# Patient Record
Sex: Male | Born: 1970 | Race: White | Hispanic: No | Marital: Single | Smoking: Former smoker
Health system: Southern US, Community
[De-identification: ages and names within clinical notes are randomized; demographics above are authoritative.]

## PROBLEM LIST (undated history)

## (undated) DIAGNOSIS — T4145XA Adverse effect of unspecified anesthetic, initial encounter: Secondary | ICD-10-CM

## (undated) DIAGNOSIS — I10 Essential (primary) hypertension: Secondary | ICD-10-CM

## (undated) DIAGNOSIS — T8859XA Other complications of anesthesia, initial encounter: Secondary | ICD-10-CM

## (undated) DIAGNOSIS — M1990 Unspecified osteoarthritis, unspecified site: Secondary | ICD-10-CM

## (undated) DIAGNOSIS — J189 Pneumonia, unspecified organism: Secondary | ICD-10-CM

## (undated) DIAGNOSIS — N419 Inflammatory disease of prostate, unspecified: Secondary | ICD-10-CM

## (undated) HISTORY — PX: CYST EXCISION: SHX5701

## (undated) HISTORY — PX: OTHER SURGICAL HISTORY: SHX169

---
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
## 2011-01-19 ENCOUNTER — Ambulatory Visit: Payer: Self-pay | Admitting: Internal Medicine

## 2016-03-16 ENCOUNTER — Other Ambulatory Visit: Payer: Self-pay | Admitting: Orthopaedic Surgery

## 2016-03-18 ENCOUNTER — Encounter (HOSPITAL_COMMUNITY): Payer: Self-pay

## 2016-03-18 ENCOUNTER — Encounter (HOSPITAL_COMMUNITY)
Admission: RE | Admit: 2016-03-18 | Discharge: 2016-03-18 | Disposition: A | Discharge status: Home / Self Care | Payer: BLUE CROSS/BLUE SHIELD | Source: Ambulatory Visit | Attending: Orthopaedic Surgery | Admitting: Orthopaedic Surgery

## 2016-03-18 DIAGNOSIS — I10 Essential (primary) hypertension: Secondary | ICD-10-CM | POA: Insufficient documentation

## 2016-03-18 DIAGNOSIS — Z01812 Encounter for preprocedural laboratory examination: Secondary | ICD-10-CM | POA: Diagnosis not present

## 2016-03-18 DIAGNOSIS — R001 Bradycardia, unspecified: Secondary | ICD-10-CM | POA: Insufficient documentation

## 2016-03-18 DIAGNOSIS — M1612 Unilateral primary osteoarthritis, left hip: Secondary | ICD-10-CM | POA: Diagnosis not present

## 2016-03-18 DIAGNOSIS — Z01818 Encounter for other preprocedural examination: Secondary | ICD-10-CM | POA: Diagnosis present

## 2016-03-18 HISTORY — DX: Inflammatory disease of prostate, unspecified: N41.9

## 2016-03-18 HISTORY — DX: Essential (primary) hypertension: I10

## 2016-03-18 HISTORY — DX: Unspecified osteoarthritis, unspecified site: M19.90

## 2016-03-18 HISTORY — DX: Pneumonia, unspecified organism: J18.9

## 2016-03-18 LAB — BASIC METABOLIC PANEL
ANION GAP: 9 (ref 5–15)
BUN: 11 mg/dL (ref 6–20)
CALCIUM: 8.9 mg/dL (ref 8.9–10.3)
CO2: 26 mmol/L (ref 22–32)
Chloride: 102 mmol/L (ref 101–111)
Creatinine, Ser: 0.87 mg/dL (ref 0.61–1.24)
GFR calc Af Amer: >60 mL/min (ref >60)
GLUCOSE: 91 mg/dL (ref 65–99)
POTASSIUM: 3.7 mmol/L (ref 3.5–5.1)
SODIUM: 137 mmol/L (ref 135–145)

## 2016-03-18 LAB — CBC
HCT: 42 % (ref 39.0–52.0)
HEMOGLOBIN: 14.6 g/dL (ref 13.0–17.0)
MCH: 31.5 pg (ref 26.0–34.0)
MCHC: 34.8 g/dL (ref 30.0–36.0)
MCV: 90.7 fL (ref 78.0–100.0)
Platelets: 191 10*3/uL (ref 150–400)
RBC: 4.63 MIL/uL (ref 4.22–5.81)
RDW: 12.3 % (ref 11.5–15.5)
WBC: 4.7 10*3/uL (ref 4.0–10.5)

## 2016-03-18 LAB — PROTIME-INR
INR: 1.09 (ref 0.00–1.49)
PROTHROMBIN TIME: 13.8 s (ref 11.6–15.2)

## 2016-03-18 LAB — SURGICAL PCR SCREEN
MRSA, PCR: NEGATIVE
STAPHYLOCOCCUS AUREUS: NEGATIVE

## 2016-03-18 NOTE — Patient Instructions (Signed)
Neithan Day Much  03/18/2016   Your procedure is scheduled on: Friday April 02, 2016  Report to [facility] Main  Entrance take [place]  elevators to 3rd floor to  [place] at 11:15 AM.  Call this number if you have problems the morning of surgery [phone]   Remember: ONLY 1 PERSON MAY GO WITH YOU TO SHORT STAY TO GET  READY MORNING OF [place].  Do not eat food After Midnight but may take clear liquids till 7:15 am day of surgery then nothing by mouth.      Take these medicines the morning of surgery with A SIP OF WATER: Propranolol (Inderal)                               You may not have any metal on your body including hair pins and              piercings  Do not wear jewelry,lotions, powders or colognes, deodorant             Men may shave face and neck.   Do not bring valuables to the hospital. [place].  Contacts, dentures or bridgework may not be worn into surgery.  Leave suitcase in the car. After surgery it may be brought to your room.                Please read over the following fact sheets you were given:MRSA INFORMATION SHEET  _____________________________________________________________________             [facility] - Preparing for Surgery Before surgery, you can play an important role.  Because skin is not sterile, your skin needs to be as free of germs as possible.  You can reduce the number of germs on your skin by washing with CHG (chlorahexidine gluconate) soap before surgery.  CHG is an antiseptic cleaner which kills germs and bonds with the skin to continue killing germs even after washing. Please DO NOT use if you have an allergy to CHG or antibacterial soaps.  If your skin becomes reddened/irritated stop using the CHG and inform your nurse when you arrive at Short Stay. Do not shave (including legs and underarms) for at least 48 hours prior to the first CHG shower.  You may  shave your face/neck. Please follow these instructions carefully:  1.  Shower with CHG Soap the night before surgery and the  morning of Surgery.  2.  If you choose to wash your hair, wash your hair first as usual with your  normal  shampoo.  3.  After you shampoo, rinse your hair and body thoroughly to remove the  shampoo.                           4.  Use CHG as you would any other liquid soap.  You can apply chg directly  to the skin and wash                       Gently with a scrungie or clean washcloth.  5.  Apply the CHG Soap to your body ONLY FROM THE NECK DOWN.   Do not use  on face/ open                           Wound or open sores. Avoid contact with eyes, ears mouth and genitals (private parts).                       Wash face,  Genitals (private parts) with your normal soap.             6.  Wash thoroughly, paying special attention to the area where your surgery  will be performed.  7.  Thoroughly rinse your body with warm water from the neck down.  8.  DO NOT shower/wash with your normal soap after using and rinsing off  the CHG Soap.                9.  Pat yourself dry with a clean towel.            10.  Wear clean pajamas.            11.  Place clean sheets on your bed the night of your first shower and do not  sleep with pets. Day of Surgery : Do not apply any lotions/deodorants the morning of surgery.  Please wear clean clothes to the hospital/surgery center.  FAILURE TO FOLLOW THESE INSTRUCTIONS MAY RESULT IN THE CANCELLATION OF YOUR SURGERY PATIENT SIGNATURE_________________________________  NURSE SIGNATURE__________________________________  ________________________________________________________________________    CLEAR LIQUID DIET   Foods Allowed                                                                     Foods Excluded  Coffee and tea, regular and decaf                             liquids that you cannot  Plain Jell-O in any flavor                                              see through such as: Fruit ices (not with fruit pulp)                                     milk, soups, orange juice  Iced Popsicles                                    All solid food Carbonated beverages, regular and diet                                    Cranberry, grape and apple juices Sports drinks like Gatorade Lightly seasoned clear broth or consume(fat free) Sugar, honey syrup  Sample Menu Breakfast  Lunch                                     Supper Cranberry juice                    Beef broth                            Chicken broth Jell-O                                     Grape juice                           Apple juice Coffee or tea                        Jell-O                                      Popsicle                                                Coffee or tea                        Coffee or tea  _____________________________________________________________________

## 2016-04-02 ENCOUNTER — Inpatient Hospital Stay (HOSPITAL_COMMUNITY): Payer: BLUE CROSS/BLUE SHIELD | Admitting: Certified Registered Nurse Anesthetist

## 2016-04-02 ENCOUNTER — Inpatient Hospital Stay (HOSPITAL_COMMUNITY): Payer: BLUE CROSS/BLUE SHIELD

## 2016-04-02 ENCOUNTER — Inpatient Hospital Stay (HOSPITAL_COMMUNITY)
Admission: RE | Admit: 2016-04-02 | Discharge: 2016-04-06 | DRG: 470 | Disposition: A | Discharge status: Home Health | Payer: BLUE CROSS/BLUE SHIELD | Source: Ambulatory Visit | Attending: Orthopaedic Surgery | Admitting: Orthopaedic Surgery

## 2016-04-02 ENCOUNTER — Encounter (HOSPITAL_COMMUNITY): Payer: Self-pay | Admitting: *Deleted

## 2016-04-02 ENCOUNTER — Encounter (HOSPITAL_COMMUNITY)
Admission: RE | Disposition: A | Discharge status: Home Health | Payer: Self-pay | Source: Ambulatory Visit | Attending: Orthopaedic Surgery

## 2016-04-02 DIAGNOSIS — Z96642 Presence of left artificial hip joint: Secondary | ICD-10-CM

## 2016-04-02 DIAGNOSIS — Y844 Aspiration of fluid as the cause of abnormal reaction of the patient, or of later complication, without mention of misadventure at the time of the procedure: Secondary | ICD-10-CM | POA: Diagnosis not present

## 2016-04-02 DIAGNOSIS — Z87891 Personal history of nicotine dependence: Secondary | ICD-10-CM | POA: Diagnosis not present

## 2016-04-02 DIAGNOSIS — I10 Essential (primary) hypertension: Secondary | ICD-10-CM | POA: Diagnosis present

## 2016-04-02 DIAGNOSIS — M1612 Unilateral primary osteoarthritis, left hip: Secondary | ICD-10-CM | POA: Diagnosis present

## 2016-04-02 DIAGNOSIS — G971 Other reaction to spinal and lumbar puncture: Secondary | ICD-10-CM | POA: Diagnosis not present

## 2016-04-02 DIAGNOSIS — Z419 Encounter for procedure for purposes other than remedying health state, unspecified: Secondary | ICD-10-CM

## 2016-04-02 DIAGNOSIS — M25552 Pain in left hip: Secondary | ICD-10-CM | POA: Diagnosis present

## 2016-04-02 HISTORY — PX: TOTAL HIP ARTHROPLASTY: SHX124

## 2016-04-02 LAB — TYPE AND SCREEN
ABO/RH(D): O NEG
Antibody Screen: NEGATIVE

## 2016-04-02 LAB — ABO/RH: ABO/RH(D): O NEG

## 2016-04-02 SURGERY — ARTHROPLASTY, HIP, TOTAL, ANTERIOR APPROACH
Anesthesia: Spinal | Site: Hip | Laterality: Left

## 2016-04-02 MED ORDER — LACTATED RINGERS IV SOLN
INTRAVENOUS | Status: DC | PRN
  Administered 2016-04-02 (×3): via INTRAVENOUS

## 2016-04-02 MED ORDER — ONDANSETRON HCL 4 MG/2ML IJ SOLN
INTRAMUSCULAR | Status: AC
Start: 2016-04-02 — End: 2016-04-02
  Filled 2016-04-02: qty 2

## 2016-04-02 MED ORDER — FENTANYL CITRATE (PF) 100 MCG/2ML IJ SOLN: 25.0000 ug | INTRAMUSCULAR | Status: DC | PRN

## 2016-04-02 MED ORDER — KETOROLAC TROMETHAMINE 15 MG/ML IJ SOLN
7.5000 mg | Freq: Four times a day (QID) | INTRAMUSCULAR | Status: AC
  Administered 2016-04-02 – 2016-04-03 (×4): 7.5 mg via INTRAVENOUS
  Filled 2016-04-02 (×3): qty 1

## 2016-04-02 MED ORDER — PROPOFOL 10 MG/ML IV BOLUS
INTRAVENOUS | Status: AC
  Filled 2016-04-02: qty 40

## 2016-04-02 MED ORDER — PHENYLEPHRINE HCL 10 MG/ML IJ SOLN
INTRAMUSCULAR | Status: DC | PRN
  Administered 2016-04-02 (×4): 80 ug via INTRAVENOUS

## 2016-04-02 MED ORDER — ASPIRIN EC 325 MG PO TBEC
325.0000 mg | DELAYED_RELEASE_TABLET | Freq: Two times a day (BID) | ORAL | Status: DC
  Administered 2016-04-03 – 2016-04-06 (×7): 325 mg via ORAL
  Filled 2016-04-02 (×7): qty 1

## 2016-04-02 MED ORDER — METOCLOPRAMIDE HCL 5 MG PO TABS
5.0000 mg | ORAL_TABLET | Freq: Three times a day (TID) | ORAL | Status: DC | PRN
Start: 2016-04-02 — End: 2016-04-06

## 2016-04-02 MED ORDER — SODIUM CHLORIDE 0.9 % IR SOLN
Status: DC | PRN
  Administered 2016-04-02: 1000 mL

## 2016-04-02 MED ORDER — DEXAMETHASONE SODIUM PHOSPHATE 10 MG/ML IJ SOLN
INTRAMUSCULAR | Status: AC
  Filled 2016-04-02: qty 1

## 2016-04-02 MED ORDER — PROPRANOLOL HCL ER 80 MG PO CP24
80.0000 mg | ORAL_CAPSULE | Freq: Every day | ORAL | Status: DC
  Administered 2016-04-03 – 2016-04-05 (×3): 80 mg via ORAL
  Filled 2016-04-02 (×4): qty 1

## 2016-04-02 MED ORDER — ONDANSETRON HCL 4 MG/2ML IJ SOLN
INTRAMUSCULAR | Status: DC | PRN
  Administered 2016-04-02: 4 mg via INTRAVENOUS

## 2016-04-02 MED ORDER — POLYETHYLENE GLYCOL 3350 17 G PO PACK: 17.0000 g | PACK | Freq: Every day | ORAL | Status: DC | PRN

## 2016-04-02 MED ORDER — HYDROMORPHONE HCL 1 MG/ML IJ SOLN
0.2500 mg | INTRAMUSCULAR | Status: DC | PRN
  Administered 2016-04-02 (×3): 0.5 mg via INTRAVENOUS

## 2016-04-02 MED ORDER — SODIUM CHLORIDE 0.9 % IV SOLN
INTRAVENOUS | Status: DC
  Administered 2016-04-02 – 2016-04-04 (×5): via INTRAVENOUS

## 2016-04-02 MED ORDER — CEFAZOLIN SODIUM-DEXTROSE 2-4 GM/100ML-% IV SOLN
2.0000 g | INTRAVENOUS | Status: AC
  Administered 2016-04-02: 2 g via INTRAVENOUS

## 2016-04-02 MED ORDER — CEFAZOLIN SODIUM-DEXTROSE 2-4 GM/100ML-% IV SOLN
INTRAVENOUS | Status: AC
  Filled 2016-04-02: qty 100

## 2016-04-02 MED ORDER — PHENYLEPHRINE 40 MCG/ML (10ML) SYRINGE FOR IV PUSH (FOR BLOOD PRESSURE SUPPORT)
PREFILLED_SYRINGE | INTRAVENOUS | Status: AC
  Filled 2016-04-02: qty 10

## 2016-04-02 MED ORDER — MIDAZOLAM HCL 5 MG/5ML IJ SOLN
INTRAMUSCULAR | Status: DC | PRN
  Administered 2016-04-02: 2 mg via INTRAVENOUS

## 2016-04-02 MED ORDER — MIDAZOLAM HCL 2 MG/2ML IJ SOLN
INTRAMUSCULAR | Status: AC
Start: 2016-04-02 — End: 2016-04-02
  Filled 2016-04-02: qty 2

## 2016-04-02 MED ORDER — METOCLOPRAMIDE HCL 5 MG/ML IJ SOLN: 5.0000 mg | Freq: Three times a day (TID) | INTRAMUSCULAR | Status: DC | PRN

## 2016-04-02 MED ORDER — FENTANYL CITRATE (PF) 100 MCG/2ML IJ SOLN
INTRAMUSCULAR | Status: AC
Start: 2016-04-02 — End: 2016-04-02
  Filled 2016-04-02: qty 2

## 2016-04-02 MED ORDER — HYDROMORPHONE HCL 1 MG/ML IJ SOLN
INTRAMUSCULAR | Status: AC
  Filled 2016-04-02: qty 1

## 2016-04-02 MED ORDER — OXYCODONE HCL 5 MG PO TABS
5.0000 mg | ORAL_TABLET | ORAL | Status: DC | PRN
  Administered 2016-04-03 – 2016-04-06 (×13): 10 mg via ORAL
  Filled 2016-04-02 (×14): qty 2

## 2016-04-02 MED ORDER — MENTHOL 3 MG MT LOZG: 1.0000 | LOZENGE | OROMUCOSAL | Status: DC | PRN

## 2016-04-02 MED ORDER — METHOCARBAMOL 1000 MG/10ML IJ SOLN
500.0000 mg | Freq: Four times a day (QID) | INTRAVENOUS | Status: DC | PRN
  Administered 2016-04-02 – 2016-04-03 (×3): 500 mg via INTRAVENOUS
  Filled 2016-04-02: qty 550
  Filled 2016-04-02: qty 5
  Filled 2016-04-02: qty 550
  Filled 2016-04-02 (×3): qty 5

## 2016-04-02 MED ORDER — DIPHENHYDRAMINE HCL 12.5 MG/5ML PO ELIX: 12.5000 mg | ORAL_SOLUTION | ORAL | Status: DC | PRN

## 2016-04-02 MED ORDER — FENTANYL CITRATE (PF) 100 MCG/2ML IJ SOLN
INTRAMUSCULAR | Status: AC
  Filled 2016-04-02: qty 2

## 2016-04-02 MED ORDER — MEPERIDINE HCL 50 MG/ML IJ SOLN: 6.2500 mg | INTRAMUSCULAR | Status: DC | PRN

## 2016-04-02 MED ORDER — PHENOL 1.4 % MT LIQD
1.0000 | OROMUCOSAL | Status: DC | PRN
Start: 2016-04-02 — End: 2016-04-06
  Filled 2016-04-02: qty 177

## 2016-04-02 MED ORDER — PROPOFOL 500 MG/50ML IV EMUL
INTRAVENOUS | Status: DC | PRN
  Administered 2016-04-02: 100 ug/kg/min via INTRAVENOUS

## 2016-04-02 MED ORDER — PROPOFOL 10 MG/ML IV BOLUS
INTRAVENOUS | Status: AC
  Filled 2016-04-02: qty 20

## 2016-04-02 MED ORDER — LIDOCAINE HCL (CARDIAC) 20 MG/ML IV SOLN
INTRAVENOUS | Status: DC | PRN
  Administered 2016-04-02: 50 mg via INTRAVENOUS

## 2016-04-02 MED ORDER — DOCUSATE SODIUM 100 MG PO CAPS
100.0000 mg | ORAL_CAPSULE | Freq: Two times a day (BID) | ORAL | Status: DC
  Administered 2016-04-02 – 2016-04-06 (×7): 100 mg via ORAL
  Filled 2016-04-02 (×7): qty 1

## 2016-04-02 MED ORDER — METHOCARBAMOL 500 MG PO TABS
500.0000 mg | ORAL_TABLET | Freq: Four times a day (QID) | ORAL | Status: DC | PRN
  Administered 2016-04-03 – 2016-04-06 (×8): 500 mg via ORAL
  Filled 2016-04-02 (×9): qty 1

## 2016-04-02 MED ORDER — TRANEXAMIC ACID 1000 MG/10ML IV SOLN
1000.0000 mg | INTRAVENOUS | Status: AC
  Administered 2016-04-02: 1000 mg via INTRAVENOUS
  Filled 2016-04-02: qty 10

## 2016-04-02 MED ORDER — ALUM & MAG HYDROXIDE-SIMETH 200-200-20 MG/5ML PO SUSP: 30.0000 mL | ORAL | Status: DC | PRN

## 2016-04-02 MED ORDER — HYDROMORPHONE HCL 1 MG/ML IJ SOLN
1.0000 mg | INTRAMUSCULAR | Status: DC | PRN
  Administered 2016-04-02 – 2016-04-03 (×4): 1 mg via INTRAVENOUS
  Filled 2016-04-02 (×4): qty 1

## 2016-04-02 MED ORDER — ONDANSETRON HCL 4 MG PO TABS: 4.0000 mg | ORAL_TABLET | Freq: Four times a day (QID) | ORAL | Status: DC | PRN

## 2016-04-02 MED ORDER — CEFAZOLIN IN D5W 1 GM/50ML IV SOLN
1.0000 g | Freq: Four times a day (QID) | INTRAVENOUS | Status: AC
  Administered 2016-04-02 – 2016-04-03 (×2): 1 g via INTRAVENOUS
  Filled 2016-04-02 (×3): qty 50

## 2016-04-02 MED ORDER — BUPIVACAINE IN DEXTROSE 0.75-8.25 % IT SOLN
INTRATHECAL | Status: DC | PRN
  Administered 2016-04-02: 1.8 mL via INTRATHECAL

## 2016-04-02 MED ORDER — FENTANYL CITRATE (PF) 100 MCG/2ML IJ SOLN
INTRAMUSCULAR | Status: DC | PRN
  Administered 2016-04-02 (×2): 50 ug via INTRAVENOUS

## 2016-04-02 MED ORDER — PROMETHAZINE HCL 25 MG/ML IJ SOLN: 6.2500 mg | INTRAMUSCULAR | Status: DC | PRN

## 2016-04-02 MED ORDER — ACETAMINOPHEN 325 MG PO TABS
650.0000 mg | ORAL_TABLET | Freq: Four times a day (QID) | ORAL | Status: DC | PRN
  Administered 2016-04-03 – 2016-04-06 (×5): 650 mg via ORAL
  Filled 2016-04-02 (×5): qty 2

## 2016-04-02 MED ORDER — ZOLPIDEM TARTRATE 5 MG PO TABS: 5.0000 mg | ORAL_TABLET | Freq: Every evening | ORAL | Status: DC | PRN

## 2016-04-02 MED ORDER — ACETAMINOPHEN 650 MG RE SUPP: 650.0000 mg | Freq: Four times a day (QID) | RECTAL | Status: DC | PRN

## 2016-04-02 MED ORDER — ONDANSETRON HCL 4 MG/2ML IJ SOLN
4.0000 mg | Freq: Four times a day (QID) | INTRAMUSCULAR | Status: DC | PRN
  Administered 2016-04-02 – 2016-04-03 (×2): 4 mg via INTRAVENOUS
  Filled 2016-04-02 (×2): qty 2

## 2016-04-02 MED ORDER — EPHEDRINE SULFATE 50 MG/ML IJ SOLN
INTRAMUSCULAR | Status: DC | PRN
  Administered 2016-04-02: 5 mg via INTRAVENOUS
  Administered 2016-04-02: 10 mg via INTRAVENOUS
  Administered 2016-04-02: 5 mg via INTRAVENOUS
  Administered 2016-04-02: 10 mg via INTRAVENOUS

## 2016-04-02 SURGICAL SUPPLY — 34 items
BAG ZIPLOCK 12X15 (MISCELLANEOUS) IMPLANT
BENZOIN TINCTURE PRP APPL 2/3 (GAUZE/BANDAGES/DRESSINGS) IMPLANT
BLADE SAW SGTL 18X1.27X75 (BLADE) ×2 IMPLANT
CAPT HIP TOTAL 2 ×2 IMPLANT
CELLS DAT CNTRL 66122 CELL SVR (MISCELLANEOUS) ×1 IMPLANT
CLOTH BEACON ORANGE TIMEOUT ST (SAFETY) ×2 IMPLANT
DRAPE STERI IOBAN 125X83 (DRAPES) ×2 IMPLANT
DRAPE U-SHAPE 47X51 STRL (DRAPES) ×4 IMPLANT
DRSG AQUACEL AG ADV 3.5X10 (GAUZE/BANDAGES/DRESSINGS) ×2 IMPLANT
DURAPREP 26ML APPLICATOR (WOUND CARE) IMPLANT
ELECT REM PT RETURN 9FT ADLT (ELECTROSURGICAL) ×2
ELECTRODE REM PT RTRN 9FT ADLT (ELECTROSURGICAL) ×1 IMPLANT
GAUZE XEROFORM 1X8 LF (GAUZE/BANDAGES/DRESSINGS) IMPLANT
GLOVE BIO SURGEON STRL SZ7.5 (GLOVE) ×2 IMPLANT
GLOVE BIOGEL PI IND STRL 8 (GLOVE) ×2 IMPLANT
GLOVE BIOGEL PI INDICATOR 8 (GLOVE) ×2
GLOVE ECLIPSE 8.0 STRL XLNG CF (GLOVE) ×2 IMPLANT
GOWN STRL REUS W/TWL XL LVL3 (GOWN DISPOSABLE) ×4 IMPLANT
HANDPIECE INTERPULSE COAX TIP (DISPOSABLE) ×1
HOLDER FOLEY CATH W/STRAP (MISCELLANEOUS) ×2 IMPLANT
PACK ANTERIOR HIP CUSTOM (KITS) ×2 IMPLANT
RTRCTR WOUND ALEXIS 18CM MED (MISCELLANEOUS) ×2
SET HNDPC FAN SPRY TIP SCT (DISPOSABLE) ×1 IMPLANT
STAPLER VISISTAT 35W (STAPLE) IMPLANT
STRIP CLOSURE SKIN 1/2X4 (GAUZE/BANDAGES/DRESSINGS) IMPLANT
SUT ETHIBOND NAB CT1 #1 30IN (SUTURE) ×2 IMPLANT
SUT MNCRL AB 4-0 PS2 18 (SUTURE) IMPLANT
SUT VIC AB 0 CT1 36 (SUTURE) ×2 IMPLANT
SUT VIC AB 1 CT1 36 (SUTURE) ×2 IMPLANT
SUT VIC AB 2-0 CT1 27 (SUTURE) ×2
SUT VIC AB 2-0 CT1 TAPERPNT 27 (SUTURE) ×2 IMPLANT
TRAY FOLEY W/METER SILVER 14FR (SET/KITS/TRAYS/PACK) IMPLANT
TRAY FOLEY W/METER SILVER 16FR (SET/KITS/TRAYS/PACK) ×2 IMPLANT
YANKAUER SUCT BULB TIP NO VENT (SUCTIONS) ×2 IMPLANT

## 2016-04-02 NOTE — Transfer of Care (Signed)
Immediate Anesthesia Transfer of Care Note  Patient: Jackson White  Procedure(s) Performed: Procedure(s): LEFT TOTAL HIP ARTHROPLASTY ANTERIOR APPROACH (Left)  Patient Location: PACU  Anesthesia Type:MAC and Spinal  Level of Consciousness:  sedated, patient cooperative and responds to stimulation  Airway & Oxygen Therapy:Patient Spontanous Breathing and Patient connected to face mask oxgen  Post-op Assessment:  Report given to PACU RN and Post -op Vital signs reviewed and stable  Post vital signs:  Reviewed and stable  Last Vitals:  Filed Vitals:   04/02/16 1120  BP: 137/74  Pulse: 60  Temp: 36.7 C  Resp: 16    Complications: No apparent anesthesia complications

## 2016-04-02 NOTE — Anesthesia Preprocedure Evaluation (Signed)
Anesthesia Evaluation  Patient identified by MRN, date of birth, ID band  Reviewed: Allergy & Precautions, H&P , NPO status , Patient's Chart, lab work & pertinent test results, reviewed documented beta blocker date and time   Airway Mallampati: II  TM Distance: >3 FB Neck ROM: full    Dental no notable dental hx. (+) Teeth Intact   Pulmonary former smoker,    Pulmonary exam normal        Cardiovascular hypertension, Pt. on home beta blockers Normal cardiovascular exam     Neuro/Psych negative neurological ROS  negative psych ROS   GI/Hepatic negative GI ROS, Neg liver ROS,   Endo/Other  negative endocrine ROS  Renal/GU negative Renal ROS     Musculoskeletal   Abdominal Normal abdominal exam  (+)   Peds  Hematology negative hematology ROS (+)   Anesthesia Other Findings   Reproductive/Obstetrics negative OB ROS                             Anesthesia Physical Anesthesia Plan  ASA: II  Anesthesia Plan: Spinal   Post-op Pain Management:    Induction:   Airway Management Planned:   Additional Equipment:   Intra-op Plan:   Post-operative Plan:   Informed Consent: I have reviewed the patients History and Physical, chart, labs and discussed the procedure including the risks, benefits and alternatives for the proposed anesthesia with the patient or authorized representative who has indicated his/her understanding and acceptance.   Dental Advisory Given  Plan Discussed with: CRNA and Surgeon  Anesthesia Plan Comments:         Anesthesia Quick Evaluation

## 2016-04-02 NOTE — H&P (Signed)
TOTAL HIP ADMISSION H&P  Patient is admitted for left total hip arthroplasty.  Subjective:  Chief Complaint: left hip pain  HPI: Jackson White, 45 y.o. male, has a history of pain and functional disability in the left hip(s) due to arthritis and patient has failed non-surgical conservative treatments for greater than 12 weeks to include NSAID's and/or analgesics, corticosteriod injections, flexibility and strengthening excercises, weight reduction as appropriate and activity modification.  Onset of symptoms was gradual starting 5 years ago with gradually worsening course since that time.The patient noted no past surgery on the left hip(s).  Patient currently rates pain in the left hip at 10 out of 10 with activity. Patient has night pain, worsening of pain with activity and weight bearing, trendelenberg gait, pain that interfers with activities of daily living, pain with passive range of motion and crepitus. Patient has evidence of subchondral cysts, subchondral sclerosis, periarticular osteophytes and joint space narrowing by imaging studies. This condition presents safety issues increasing the risk of falls.  There is no current active infection.  Patient Active Problem List   Diagnosis Date Noted  . Osteoarthritis of left hip 04/02/2016   Past Medical History  Diagnosis Date  . Hypertension   . Pneumonia   . Prostatitis   . Arthritis     Past Surgical History  Procedure Laterality Date  . [place] hip resurface      right hip 2008  . Cyst excision    . Skin cancer removed       right shoulder area 3 years ago     No prescriptions prior to admission   Allergies  Allergen Reactions  . Hydrocodone-Acetaminophen Itching  . Meloxicam Itching    Social History  Substance Use Topics  . Smoking status: Former Smoker -- 0.25 packs/day for 12 years    Types: Cigarettes    Quit date: 09/20/2012  . Smokeless tobacco: Never Used  . Alcohol Use: Yes     Comment: 5 times per week      No family history on file.   Review of Systems  Musculoskeletal: Positive for joint pain.  All other systems reviewed and are negative.   Objective:  Physical Exam  Constitutional: He is oriented to person, place, and time. He appears well-developed and well-nourished.  HENT:  Head: Normocephalic and atraumatic.  Eyes: EOM are normal. Pupils are equal, round, and reactive to light.  Neck: Normal range of motion. Neck supple.  Cardiovascular: Normal rate and regular rhythm.   Respiratory: Effort normal and breath sounds normal.  GI: Soft. Bowel sounds are normal.  Musculoskeletal:       Left hip: He exhibits decreased range of motion, decreased strength, tenderness and bony tenderness.  Neurological: He is alert and oriented to person, place, and time.  Skin: Skin is warm and dry.  Psychiatric: He has a normal mood and affect.    Vital signs in last 24 hours:    Labs:   There is no height or weight on file to calculate BMI.   Imaging Review Plain radiographs demonstrate severe degenerative joint disease of the left hip(s). The bone quality appears to be excellent for age and reported activity level.  Assessment/Plan:  End stage arthritis, left hip(s)  The patient history, physical examination, clinical judgement of the provider and imaging studies are consistent with end stage degenerative joint disease of the left hip(s) and total hip arthroplasty is deemed medically necessary. The treatment options including medical management, injection therapy, arthroscopy and arthroplasty  were discussed at length. The risks and benefits of total hip arthroplasty were presented and reviewed. The risks due to aseptic loosening, infection, stiffness, dislocation/subluxation,  thromboembolic complications and other imponderables were discussed.  The patient acknowledged the explanation, agreed to proceed with the plan and consent was signed. Patient is being admitted for inpatient  treatment for surgery, pain control, PT, OT, prophylactic antibiotics, VTE prophylaxis, progressive ambulation and ADL's and discharge planning.The patient is planning to be discharged home with home health services

## 2016-04-02 NOTE — Anesthesia Postprocedure Evaluation (Signed)
Anesthesia Post Note  Patient: Jackson White  Procedure(s) Performed: Procedure(s) (LRB): LEFT TOTAL HIP ARTHROPLASTY ANTERIOR APPROACH (Left)  Patient location during evaluation: PACU Anesthesia Type: Spinal Level of consciousness: awake Pain management: pain level controlled Vital Signs Assessment: post-procedure vital signs reviewed and stable Respiratory status: spontaneous breathing Cardiovascular status: stable Postop Assessment: no headache, no backache, spinal receding, patient able to bend at knees and no signs of nausea or vomiting Anesthetic complications: no     Last Vitals:  Filed Vitals:   04/02/16 1530 04/02/16 1545  BP: 113/75 111/63  Pulse: 62 65  Temp:    Resp: 12 5    Last Pain:  Filed Vitals:   04/02/16 1553  PainSc: 3    Pain Goal: Patients Stated Pain Goal: 3 (04/02/16 1146)               Vraj Denardo JR,JOHN Alyssah Algeo

## 2016-04-02 NOTE — Anesthesia Procedure Notes (Addendum)
Spinal Patient location during procedure: OR Start time: 04/02/2016 1:30 PM End time: 04/02/2016 1:33 PM Staffing Anesthesiologist: Lyn Hollingshead Preanesthetic Checklist Completed: patient identified, surgical consent, pre-op evaluation, timeout performed, IV checked, risks and benefits discussed and monitors and equipment checked Spinal Block Patient position: sitting Prep: ChloraPrep and DuraPrep Patient monitoring: heart rate, cardiac monitor, continuous pulse ox and blood pressure Approach: midline Location: L3-4 Injection technique: single-shot Needle Needle type: Pencan  Needle gauge: 24 G Needle length: 9 cm Needle insertion depth: 5 cm Assessment Sensory level: T10  Procedure Name: MAC Date/Time: 04/02/2016 1:25 PM Performed by: West Pugh Pre-anesthesia Checklist: Patient identified, Timeout performed, Emergency Drugs available, Suction available and Patient being monitored Patient Re-evaluated:Patient Re-evaluated prior to inductionOxygen Delivery Method: Simple face mask Dental Injury: Teeth and Oropharynx as per pre-operative assessment

## 2016-04-02 NOTE — Brief Op Note (Signed)
04/02/2016  2:48 PM  PATIENT:  Jackson White  45 y.o. male  PRE-OPERATIVE DIAGNOSIS:  Severe endstage arthritis left hip  POST-OPERATIVE DIAGNOSIS:  Severe endstage arthritis left hip  PROCEDURE:  Procedure(s): LEFT TOTAL HIP ARTHROPLASTY ANTERIOR APPROACH (Left)  SURGEON:  Surgeon(s) and Role:    * Mcarthur Rossetti, MD - Primary  PHYSICIAN ASSISTANT: Benita Stabile, PA-C  ANESTHESIA:   spinal  EBL:  Total I/O In: 1000 [I.V.:1000] Out: 825 [Urine:450; Blood:375]  COUNTS:  YES  DICTATION: .Other Dictation: Dictation Number [id number]  PLAN OF CARE: Admit to inpatient   PATIENT DISPOSITION:  PACU - hemodynamically stable.   Delay start of Pharmacological VTE agent (>24hrs) due to surgical blood loss or risk of bleeding: no

## 2016-04-03 LAB — CBC
HCT: 35.5 % — ABNORMAL LOW (ref 39.0–52.0)
Hemoglobin: 11.9 g/dL — ABNORMAL LOW (ref 13.0–17.0)
MCH: 30.9 pg (ref 26.0–34.0)
MCHC: 33.5 g/dL (ref 30.0–36.0)
MCV: 92.2 fL (ref 78.0–100.0)
PLATELETS: 156 10*3/uL (ref 150–400)
RBC: 3.85 MIL/uL — ABNORMAL LOW (ref 4.22–5.81)
RDW: 11.8 % (ref 11.5–15.5)
WBC: 6.1 10*3/uL (ref 4.0–10.5)

## 2016-04-03 LAB — BASIC METABOLIC PANEL
ANION GAP: 5 (ref 5–15)
BUN: 10 mg/dL (ref 6–20)
CALCIUM: 8.1 mg/dL — AB (ref 8.9–10.3)
CO2: 27 mmol/L (ref 22–32)
Chloride: 104 mmol/L (ref 101–111)
Creatinine, Ser: 0.93 mg/dL (ref 0.61–1.24)
GFR calc Af Amer: >60 mL/min (ref >60)
GLUCOSE: 102 mg/dL — AB (ref 65–99)
Potassium: 3.8 mmol/L (ref 3.5–5.1)
Sodium: 136 mmol/L (ref 135–145)

## 2016-04-03 NOTE — Care Management Note (Signed)
Case Management Note  Patient Details  Name: BENIGNO CHECK MRN: [id number] Date of Birth: Feb 05, 1971  Subjective/Objective:                      LEFT TOTAL HIP ARTHROPLASTY ANTERIOR APPROACH (Left) Action/Plan: Discharge planning Expected Discharge Date:  04/03/16               Expected Discharge Plan:  [place]  In-House Referral:     Discharge planning Services  CM Consult  Post Acute Care Choice:  Home Health Choice offered to:  Patient  DME Arranged:  N/A DME Agency:  NA  HH Arranged:  PT [place] Agency:  [place] (now [facility])  Status of Service:  Completed, signed off  If discussed at [employer] of Stay Meetings, dates discussed:    Additional Comments: CM spoke with pt and girlfriend of pt to offer choice of home health agency.  Pt chooses [facility] to render HHPT.  Referral called to [facility], [place].  Pt states he has a rolling walker at home and does not need a 3n1.  No other CM needs were communicated. Dellie Catholic, RN 04/03/2016, 8:51 AM

## 2016-04-03 NOTE — Progress Notes (Signed)
OT Cancellation Note  Patient Details Name: Jackson White MRN: [id number] DOB: 1971-08-13   Cancelled Treatment:    Reason Eval/Treat Not Completed: Medical issues which prohibited therapy Pt on bedrest.  Will check on pt next day.  Betsy Pries 04/03/2016, 2:02 PM

## 2016-04-03 NOTE — Progress Notes (Signed)
Subjective: 1 Day Post-Op Procedure(s) (LRB): LEFT TOTAL HIP ARTHROPLASTY ANTERIOR APPROACH (Left) Patient reports pain as 10 on 0-10 scale.  Headache 10 out of 10 hip pain mild to moderate. Headache started at 3 AM when he got up to go to [facility].    Objective: Vital signs in last 24 hours: Temp:  [97.4 F (36.3 C)-98.9 F (37.2 C)] 98.9 F (37.2 C) (07/15 1034) Pulse Rate:  [50-83] 83 (07/15 1034) Resp:  [12-18] 18 (07/15 1034) BP: (101-171)/(57-93) 124/62 mmHg (07/15 1034) SpO2:  [97 %-100 %] 99 % (07/15 1034) Weight:  [97.07 kg (214 lb)] 97.07 kg (214 lb) (07/14 1146)  Intake/Output from previous day: 07/14 0701 - 07/15 0700 In: 3175 [I.V.:3125; IV Piggyback:50] Out: 1875 [Urine:1500; Blood:375] Intake/Output this shift: Total I/O In: 240 [P.O.:240] Out: 600 [Urine:600]   Recent Labs  04/03/16 0406  HGB 11.9*    Recent Labs  04/03/16 0406  WBC 6.1  RBC 3.85*  HCT 35.5*  PLT 156    Recent Labs  04/03/16 0406  NA 136  K 3.8  CL 104  CO2 27  BUN 10  CREATININE 0.93  GLUCOSE 102*  CALCIUM 8.1*   No results for input(s): LABPT, INR in the last 72 hours.  Neurologically intact  Assessment/Plan: 1 Day Post-Op Procedure(s) (LRB): LEFT TOTAL HIP ARTHROPLASTY ANTERIOR APPROACH (Left) Plan:   Bedrest for likely spinal leak headache after spinal anesthesia. If headache gone in AM we can start to sit him up.  Hold PT.   Jackson White C 04/03/2016, 11:12 AM

## 2016-04-03 NOTE — Evaluation (Addendum)
Physical Therapy Evaluation Patient Details Name: Jackson White MRN: [id number] DOB: Aug 24, 1971 Today's Date: 04/03/2016   History of Present Illness  Pt is a 45 year old male s/p L THA with hx of R THA  Clinical Impression  Pt is s/p L THA resulting in the deficits listed below (see PT Problem List).  Pt will benefit from skilled PT to increase their independence and safety with mobility to allow discharge to the venue listed below.  Pt plans to d/c home hopefully today however currently with increased pain.  Requested RN review meds with pt as he states not taking due to itching.        Follow Up Recommendations Home health PT    Equipment Recommendations  None recommended by PT    Recommendations for Other Services       Precautions / Restrictions Precautions Precautions: None Restrictions Other Position/Activity Restrictions: WBAT      Mobility  Bed Mobility Overal bed mobility: Needs Assistance Bed Mobility: Supine to Sit     Supine to sit: Supervision;HOB elevated     General bed mobility comments: verbal cues for self assist  Transfers Overall transfer level: Needs assistance Equipment used: Rolling walker (2 wheeled) Transfers: Sit to/from Stand Sit to Stand: Min guard         General transfer comment: verbal cues for hand placement  Ambulation/Gait Ambulation/Gait assistance: Min guard Ambulation Distance (Feet): 40 Feet Assistive device: Rolling walker (2 wheeled) Gait Pattern/deviations: Step-to pattern;Antalgic     General Gait Details: verbal cues for sequence, RW positioning, step length, distance limited by pain  Stairs            Wheelchair Mobility    Modified Rankin (Stroke Patients Only)       Balance                                             Pertinent Vitals/Pain Pain Assessment: 0-10 Pain Score: 7  Pain Location: L hip Pain Descriptors / Indicators: Sore;Tightness Pain Intervention(s):  Limited activity within patient's tolerance;Monitored during session;Repositioned (pt states pain meds make him itchy so he has not taken, requested RN discuss pain meds with pt)    Home Living Family/patient expects to be discharged to:: Private residence Living Arrangements: Spouse/significant other   Type of Home: House Home Access: Stairs to enter Entrance Stairs-Rails: Right Entrance Stairs-Number of Steps: 3-4 Home Layout: One level Home Equipment: Walker - 2 wheels;Cane - single point      Prior Function Level of Independence: Independent               Hand Dominance        Extremity/Trunk Assessment               Lower Extremity Assessment: LLE deficits/detail   LLE Deficits / Details: decreased functional hip strength observed, L LE requiring assist     Communication   Communication: No difficulties  Cognition Arousal/Alertness: Awake/alert Behavior During Therapy: WFL for tasks assessed/performed Overall Cognitive Status: Within Functional Limits for tasks assessed                      General Comments      Exercises        Assessment/Plan    PT Assessment Patient needs continued PT services  PT Diagnosis Difficulty walking;Acute pain   PT  Problem List Decreased strength;Decreased activity tolerance;Decreased mobility;Pain  PT Treatment Interventions Functional mobility training;Stair training;Gait training;DME instruction;Patient/family education;Therapeutic activities;Therapeutic exercise   PT Goals (Current goals can be found in the Care Plan section) Acute Rehab PT Goals PT Goal Formulation: With patient Time For Goal Achievement: 04/06/16 Potential to Achieve Goals: Good    Frequency 7X/week   Barriers to discharge        Co-evaluation               End of Session Equipment Utilized During Treatment: Gait belt Activity Tolerance: Patient limited by pain Patient left: in chair;with call bell/phone within  reach;with family/visitor present Nurse Communication: Mobility status         Time: 0953-1010 PT Time Calculation (min) (ACUTE ONLY): 17 min   Charges:   PT Evaluation $PT Eval Low Complexity: 1 Procedure     PT G Codes:        Yuchen Fedor,KATHrine E 04/03/2016, 12:07 PM Carmelia Bake, PT, DPT 04/03/2016 Pager: [phone]  **Pt was seen prior to Dr. Lorin Mercy note and bedrest order.  Pt did not report headache to PT.  RN states pt back in bed now and to remain flat on bedrest for remainder of today.  Will check back on pt tomorrow.

## 2016-04-03 NOTE — Progress Notes (Signed)
Dr. Lorin Mercy called to check on pt. Md aware HA continues with little relief. Running low grade temp. IS pushed with demonstrated understanding. Benefits of IS explained to pt and family with verbalized understanding. See new order received from Dr. Lorin Mercy to dc Dilaudid.

## 2016-04-03 NOTE — Op Note (Signed)
NAMEBRECKEN, White NO.:  [id number]  MEDICAL RECORD NO.:  [id number]  LOCATION:  23                         FACILITY:  [facility]  PHYSICIAN:  Lind Guest. 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  PROCEDURE:  Left total hip arthroplasty through direct anterior approach.  IMPLANTS:  DePuy Sector Gription acetabular component size 56, size 36+ 4 neutral polyethylene liner, size 13 Corail femoral component with standard offset, size 36+ 5 ceramic hip ball.  SURGEON:  Lind Guest. Ninfa Linden, M.D.  ASSISTANT:  Erskine Emery, PA-C.  ANESTHESIA:  Spinal.  ANTIBIOTICS:  2 g of IV Ancef.  BLOOD LOSS:  375 mL.  COMPLICATIONS:  None.  INDICATIONS:  Jackson White is only a 45 year old gentleman with severe debilitating arthritis of his left hip.  He has actually had a hip resurfacing arthroplasty several years on the right side and that was done well.  Now, his left-sided pain is daily.  He has significant limitations in his mobility and the range of motion of his hip in general.  His x-rays on this in the fact that show complete loss of his joint space, severe periarticular osteophytes, sclerotic and cystic changes.  His pain is daily, it is 10/10, it is detrimentally affected his activities of daily living, his quality of life and his mobility. At this point, he does wish to proceed with a total hip arthroplasty and this has been recommended to him through the direct anterior approach. He understands the risk of acute blood loss anemia, nerve and vessel injury, fracture, infection, dislocation, DVT.  He understands our goals are decreased pain, improved mobility, and  overall improved quality of life.  PROCEDURE DESCRIPTION:  After informed consent was obtained, appropriate left hip was marked, he was brought to the operating room and spinal anesthesia was obtained while he was on a stretcher.  Foley catheter was placed and both feet had traction boots applied to them.  Of note, his leg on the operative side lays in such an externally-rotated position. In my mind, I felt that I would need to then close down his hip socket with little more retroversion as well as that on the femoral side.  He was then placed supine on the Hana fracture table with the perineal post in place and both legs in inline skeletal traction devices, but no traction applied.  The left operative hip was prepped and draped with DuraPrep and sterile drapes.  Time-out was called.  He was identified as correct patient and correct left hip.  I then made an incision inferior and posterior to the anterior superior iliac spine and carried this obliquely down the leg.  We dissected down the tensor fascia lata muscle and then the tensor fascia was divided longitudinally, so we could proceed with a direct anterior approach to the hip.  We identified and cauterized the lateral femoral circumflex vessels and then identified the hip capsule.  I opened up the hip  capsule in an L-type format finding a joint effusion and significant periarticular osteophytes.  We opened up the joint capsule completely and then placed Cobra retractors around the medial and lateral femoral neck.  We then made our femoral neck cut with an oscillating saw and completed this with an osteotome. We placed a corkscrew guide in the femoral head and removed the femoral head in its entirety and found it to be completely devoid of cartilage. We then cleaned the acetabulum and remnants of the acetabular labrum and placed a bent Hohmann over the medial acetabular rim.  I released the transverse acetabular ligament as well.  I  then began reaming under direct visualization from a size 42 reamer up to a size 56 with all reamers under direct visualization and last reamer under direct fluoroscopy, so we could obtain our depth of reaming, our inclination and anteversion.  Once we were pleased with this, we placed the real DePuy Sector Gription acetabular component size 56 and a 36+ 4 neutral polyethylene liner.  Attention was then turned to the femur.  With the leg externally rotated to 120 degrees, extended and adducted, we were to place a Mueller retractor medially and a Hohmann retractor behind the greater trochanter.  We released the lateral joint capsule and used a box cutting osteotome to enter the femoral canal and a rongeur to lateralize.  We then began broaching from a size 8 broach up to a size 13.  With the 13 in place, we trialed a standard offset femoral neck and a 36+ 1.5 hip ball.  We brought the leg back over and up with traction and internal rotation reducing the pelvis.  I felt like we just needed a little bit more leg length and offset.  We dislocated the hip and removed the trial components.  We then placed the real Corail femoral component size 13 and the real 36+ 5 ceramic hip ball and reduced this in the acetabulum.  We were pleased with range of motion, offset, and stability.  We then irrigated the soft tissue with normal saline solution using pulsatile lavage.  We closed the joint capsule with interrupted #1 Ethibond suture followed by running #1 Vicryl in the tensor fascia, 0 Vicryl in the deep tissue, 2-0 Vicryl in the subcutaneous tissue, 4-0 Monocryl subcuticular stitch and Steri-Strips on the skin.  An Aquacel dressing was applied.  He was taken off the Hana table and taken to the recovery room in stable condition.  All final counts were correct.  There were no complications noted.  Of note, Erskine Emery, PA-C assisted in the entire case.  His assistance was crucial for facilitating  all aspects of this case.     Lind Guest. Ninfa Linden, M.D.     CYB/MEDQ  D:  04/02/2016  T:  04/03/2016  Job:  [id number]

## 2016-04-04 NOTE — Progress Notes (Signed)
Subjective: 2 Days Post-Op Procedure(s) (LRB): LEFT TOTAL HIP ARTHROPLASTY ANTERIOR APPROACH (Left) Patient reports pain as mild hip pain, still with headache but it is more than 50% improved. Will keep down until tomorrow.   Objective: Vital signs in last 24 hours: Temp:  [98.3 F (36.8 C)-100.8 F (38.2 C)] 98.3 F (36.8 C) (07/16 0540) Pulse Rate:  [69-90] 69 (07/16 0540) Resp:  [17-18] 17 (07/16 0540) BP: (109-149)/(60-78) 109/60 mmHg (07/16 0540) SpO2:  [95 %-99 %] 95 % (07/16 0540)  Intake/Output from previous day: 07/15 0701 - 07/16 0700 In: 2400 [P.O.:600; I.V.:1800] Out: 1850 [Urine:1850] Intake/Output this shift:     Recent Labs  04/03/16 0406  HGB 11.9*    Recent Labs  04/03/16 0406  WBC 6.1  RBC 3.85*  HCT 35.5*  PLT 156    Recent Labs  04/03/16 0406  NA 136  K 3.8  CL 104  CO2 27  BUN 10  CREATININE 0.93  GLUCOSE 102*  CALCIUM 8.1*   No results for input(s): LABPT, INR in the last 72 hours.  Neurologically intact  Assessment/Plan: 2 Days Post-Op Procedure(s) (LRB): LEFT TOTAL HIP ARTHROPLASTY ANTERIOR APPROACH (Left) Plan:  Bedrest still and flat for spinal headache.  He is 50% improved in headache, hopefully by tomorrow it will resolve.  Blood patch discussed in case supine positioning does not correct. Still holding PT and OT.   Jackson White C 04/04/2016, 9:47 AM

## 2016-04-04 NOTE — Progress Notes (Signed)
OT Cancellation Note  Patient Details Name: TAKASHI KOROL MRN: [id number] DOB: [birth year]   Cancelled Treatment:    Reason Eval/Treat Not Completed: Medical issues which prohibited therapy  pt continues to be on bedrest d/t spinal HA  Evalynne Locurto, Thereasa Parkin 04/04/2016, 10:04 AM

## 2016-04-04 NOTE — Progress Notes (Signed)
PT Cancellation Note  Patient Details Name: Jackson White MRN: [id number] DOB: 1971/05/09   Cancelled Treatment:    Reason Eval/Treat Not Completed: Other (comment) (pt continues to be on bedrest d/t spinal HA; will attempt again later if pt is off bedrest)   [facility] 04/04/2016, 9:11 AM

## 2016-04-05 ENCOUNTER — Inpatient Hospital Stay (HOSPITAL_COMMUNITY): Payer: BLUE CROSS/BLUE SHIELD | Admitting: Anesthesiology

## 2016-04-05 ENCOUNTER — Encounter (HOSPITAL_COMMUNITY): Payer: Self-pay | Admitting: Orthopaedic Surgery

## 2016-04-05 MED ORDER — LACTATED RINGERS IV SOLN
INTRAVENOUS | Status: DC | PRN
  Administered 2016-04-05: 10:00:00 via INTRAVENOUS

## 2016-04-05 MED ORDER — LACTATED RINGERS IV SOLN
INTRAVENOUS | Status: DC
  Administered 2016-04-05: 10:00:00 via INTRAVENOUS

## 2016-04-05 MED ORDER — FENTANYL CITRATE (PF) 100 MCG/2ML IJ SOLN
INTRAMUSCULAR | Status: DC | PRN
  Administered 2016-04-05 (×2): 50 ug via INTRAVENOUS

## 2016-04-05 MED ORDER — FENTANYL CITRATE (PF) 100 MCG/2ML IJ SOLN
INTRAMUSCULAR | Status: AC
  Filled 2016-04-05: qty 2

## 2016-04-05 NOTE — Progress Notes (Signed)
Patient ID: Jackson White, male   DOB: 11-30-1970, 45 y.o.   MRN: [id number] Still with headache.  Has been in supine, flat position all weekend post left total hip.  Spinal anesthesia.  Will consult Anesthesia today for further evaluation/tx - ? Blood patch.  Will need to keep here today.

## 2016-04-05 NOTE — Anesthesia Postprocedure Evaluation (Signed)
Anesthesia Post Note  Patient: Jackson White  Procedure(s) Performed: * No procedures listed *  Patient location during evaluation: Nursing Unit Anesthesia Type: Epidural (blodd patch) Level of consciousness: awake Pain management: pain level controlled Vital Signs Assessment: post-procedure vital signs reviewed and stable Respiratory status: spontaneous breathing Cardiovascular status: stable Postop Assessment: no signs of nausea or vomiting Anesthetic complications: no Comments: HA resolved. Patient feels "much better"    Last Vitals:  Filed Vitals:   04/05/16 1105 04/05/16 1111  BP:  133/83  Pulse: 69 70  Temp:  37.3 C  Resp: 14 15    Last Pain:  Filed Vitals:   04/05/16 1409  PainSc: 3                  Bayan Kushnir

## 2016-04-05 NOTE — Progress Notes (Signed)
OT Cancellation Note  Patient Details Name: GILMORE LIST MRN: [id number] DOB: 07/18/1971   Cancelled Treatment:    Reason Eval/Treat Not Completed: Medical issues which prohibited therapy  Pt still on bed rest.  Will check on later in day or next day.  Kari Baars, [place]  Payton Mccallum D 04/05/2016, 9:49 AM

## 2016-04-05 NOTE — Anesthesia Preprocedure Evaluation (Signed)
Anesthesia Evaluation  Patient identified by MRN, date of birth, ID band Patient awake    Reviewed: Allergy & Precautions, NPO status , Patient's Chart, lab work & pertinent test results  History of Anesthesia Complications (+) history of anesthetic complications  Airway Mallampati: II  TM Distance: >3 FB Neck ROM: Full    Dental  (+) Teeth Intact   Pulmonary neg shortness of breath, neg sleep apnea, neg COPD, neg recent URI, former smoker,    breath sounds clear to auscultation       Cardiovascular hypertension, Pt. 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  negative psych ROS   GI/Hepatic negative GI ROS, Neg liver ROS,   Endo/Other  negative endocrine ROS  Renal/GU negative Renal ROS     Musculoskeletal  (+) Arthritis ,   Abdominal   Peds  Hematology  (+) Blood dyscrasia, anemia ,   Anesthesia Other Findings   Reproductive/Obstetrics                             Anesthesia Physical Anesthesia Plan  ASA: II  Anesthesia Plan: Epidural   Post-op Pain Management:    Induction:   Airway Management Planned: Nasal Cannula  Additional Equipment: None  Intra-op Plan:   Post-operative Plan:   Informed Consent: I have reviewed the patients History and Physical, chart, labs and discussed the procedure including the risks, benefits and alternatives for the proposed anesthesia with the patient or authorized representative who has indicated his/her understanding and acceptance.   Dental advisory given  Plan Discussed with: CRNA and Surgeon  Anesthesia Plan Comments:         Anesthesia Quick Evaluation

## 2016-04-05 NOTE — Progress Notes (Signed)
PT Cancellation Note  Patient Details Name: Jackson White MRN: [id number] DOB: 1971-01-25   Cancelled Treatment:     "Bed Rest" order still active.  Spinal HA.   ? Blood patch   Rica Koyanagi  PTA [facility]  Acute  Rehab Pager      [phone]

## 2016-04-05 NOTE — Anesthesia Procedure Notes (Signed)
Epidural Patient location during procedure: post-op  Staffing Anesthesiologist: Booker Bhatnagar Performed by: anesthesiologist   Preanesthetic Checklist Completed: patient identified, surgical consent, pre-op evaluation, timeout performed, IV checked, risks and benefits discussed and monitors and equipment checked  Epidural Patient position: sitting Prep: ChloraPrep Patient monitoring: heart rate, cardiac monitor, continuous pulse ox and blood pressure Approach: midline Location: L4-L5 Injection technique: LOR saline  Needle:  Needle type: Tuohy  Needle gauge: 18 G Needle length: 9 cm Needle insertion depth: 7 cm  Assessment Events: blood not aspirated, injection not painful, no injection resistance, negative IV test and no paresthesia  Additional Notes After obtaining LOR with Saline 30 ml of patient's blood was given slowly without back pain or discomfort. Patient tolerated the procedure well without complications. 30 min after procedure patient felt better and denied HA, nausea, dizzieness or other symptomsReason for block:Epidural Blood Patch

## 2016-04-05 NOTE — Progress Notes (Signed)
Physical Therapy Treatment Patient Details Name: Jackson White MRN: [id number] DOB: [birth year] Today's Date: 04/05/2016    History of Present Illness Pt is a 45 year old male s/p L THA with hx of R THA    PT Comments    POD # 3 Pt has been on bed rest due to spinal HA.  Blood patch performed this morning.  Symptoms resolved. Assisted OOB to amb a great distance in hallway.  No HA.  Mod c/o hip stiffness and swelling.  Returned to room, assisted to bathroom then  to perform TE's followed by ICE.  Pt plans to D/C to home tomorrow.   Follow Up Recommendations  Home health PT     Equipment Recommendations       Recommendations for Other Services       Precautions / Restrictions Precautions Precautions: None Restrictions Weight Bearing Restrictions: No Other Position/Activity Restrictions: WBAT    Mobility  Bed Mobility Overal bed mobility: Needs Assistance Bed Mobility: Supine to Sit     Supine to sit: Supervision;HOB elevated     General bed mobility comments: verbal cues for self assist     increased time  Transfers Overall transfer level: Needs assistance Equipment used: Rolling walker (2 wheeled) Transfers: Sit to/from Stand Sit to Stand: Supervision         General transfer comment: verbal cues for hand placement    Increased time  Ambulation/Gait Ambulation/Gait assistance: Min guard Ambulation Distance (Feet): 85 Feet Assistive device: Rolling walker (2 wheeled) Gait Pattern/deviations: Step-to pattern;Step-through pattern     General Gait Details: verbal cues for sequence, RW positioning, step length, distance increased. No HA   Stairs            Wheelchair Mobility    Modified Rankin (Stroke Patients Only)       Balance                                    Cognition Arousal/Alertness: Awake/alert Behavior During Therapy: WFL for tasks assessed/performed Overall Cognitive Status: Within Functional Limits for tasks  assessed                      Exercises   Total Hip Replacement TE's 10 reps ankle pumps 10 reps knee presses 10 reps heel slides 10 reps SAQ's 10 reps ABD Followed by ICE     General Comments        Pertinent Vitals/Pain Pain Assessment: 0-10 Pain Score: 3  Pain Location: L hip Pain Descriptors / Indicators: Grimacing;Sore;Tender;Tightness Pain Intervention(s): Monitored during session;Premedicated before session;Repositioned;Ice applied    Home Living                      Prior Function            PT Goals (current goals can now be found in the care plan section) Progress towards PT goals: Progressing toward goals    Frequency  7X/week    PT Plan Current plan remains appropriate    Co-evaluation             End of Session Equipment Utilized During Treatment: Gait belt Activity Tolerance: Patient tolerated treatment well Patient left: in chair;with call bell/phone within reach;with family/visitor present     Time: 7253-6644 PT Time Calculation (min) (ACUTE ONLY): 24 min  Charges:  $Gait Training: 8-22 mins $Therapeutic Exercise: 8-22 mins  G Codes:      Rica Koyanagi  PTA [facility]  Acute  Rehab Pager      [phone]

## 2016-04-05 NOTE — Transfer of Care (Addendum)
Immediate Anesthesia Transfer of Care Note  Patient: Jackson White  Procedure(s) Performed: * No procedures listed *  Patient Location: PACU   Level of Consciousness: awake, alert  and oriented  Airway & Oxygen Therapy: Patient Spontanous Breathing and Patient connected to nasal cannula oxygen  Post-op Assessment: Report given to RN and Post -op Vital signs reviewed and stable  Post vital signs: Reviewed and stable  Last Vitals:  Filed Vitals:   04/05/16 0604 04/05/16 0816  BP: 128/72 126/70  Pulse: 69 65  Temp: 36.8 C   Resp: 16     Last Pain:  Filed Vitals:   04/05/16 0818  PainSc: 7       Patients Stated Pain Goal: 3 (67/12/45 8099)  Complications: No apparent anesthesia complications

## 2016-04-06 MED ORDER — ASPIRIN 325 MG PO TBEC: 325.0000 mg | DELAYED_RELEASE_TABLET | Freq: Two times a day (BID) | ORAL | Status: DC

## 2016-04-06 MED ORDER — METHOCARBAMOL 500 MG PO TABS: 500.0000 mg | ORAL_TABLET | Freq: Four times a day (QID) | ORAL | Status: DC | PRN

## 2016-04-06 MED ORDER — OXYCODONE-ACETAMINOPHEN 5-325 MG PO TABS: 1.0000 | ORAL_TABLET | ORAL | Status: DC | PRN

## 2016-04-06 NOTE — Progress Notes (Signed)
Physical Therapy Treatment Patient Details Name: NISSIM FLEISCHER MRN: [id number] DOB: 21-Apr-1971 Today's Date: 04/06/2016    History of Present Illness Pt is a 45 year old male s/p L THA revision with hx of R THA    PT Comments    Pt has met PT goals, he's ready to DC home from PT standpoint.   Follow Up Recommendations  Home health PT     Equipment Recommendations  Rolling walker with 5" wheels;3in1 (PT) (equipment he was going to borrow is now being used by pt's father)    Recommendations for Other Services       Precautions / Restrictions Precautions Precautions: None Restrictions Weight Bearing Restrictions: No Other Position/Activity Restrictions: WBAT    Mobility  Bed Mobility Overal bed mobility: Needs Assistance Bed Mobility: Supine to Sit     Supine to sit: HOB elevated;Modified independent (Device/Increase time)        Transfers Overall transfer level: Modified independent Equipment used: Rolling walker (2 wheeled) Transfers: Sit to/from Stand Sit to Stand: Modified independent (Device/Increase time);From elevated surface            Ambulation/Gait Ambulation/Gait assistance: Modified independent (Device/Increase time) Ambulation Distance (Feet): 200 Feet Assistive device: Rolling walker (2 wheeled) Gait Pattern/deviations: Step-through pattern     General Gait Details: steady with RW   Stairs Stairs: Yes Stairs assistance: Supervision Stair Management: One rail Right;Forwards;Step to pattern Number of Stairs: 3 General stair comments: VCs sequencing, no LOB; also performed one step backwards with RW (pt has 1 step inside home and 3 STE with 1 rail)  Wheelchair Mobility    Modified Rankin (Stroke Patients Only)       Balance Overall balance assessment: Modified Independent                                  Cognition Arousal/Alertness: Awake/alert Behavior During Therapy: WFL for tasks assessed/performed Overall  Cognitive Status: Within Functional Limits for tasks assessed                      Exercises Total Joint Exercises Ankle Circles/Pumps: AROM;10 reps;Both Quad Sets: AROM;10 reps;Left Short Arc Quad: AROM;Left;10 reps;Supine Heel Slides: AAROM;Left;10 reps;Supine Hip ABduction/ADduction: AAROM;Left;10 reps;Supine Long Arc Quad: AROM;Left;10 reps;Seated    General Comments        Pertinent Vitals/Pain Pain Score: 3  Pain Location: L hip Pain Descriptors / Indicators: Sore Pain Intervention(s): Limited activity within patient's tolerance;Monitored during session;Ice applied;Premedicated before session    Home Living                      Prior Function            PT Goals (current goals can now be found in the care plan section) Acute Rehab PT Goals Patient Stated Goal: decrease pain and swelling PT Goal Formulation: With patient Time For Goal Achievement: 04/06/16 Potential to Achieve Goals: Good Progress towards PT goals: Goals met/education completed, patient discharged from PT    Frequency  7X/week    PT Plan Current plan remains appropriate    Co-evaluation             End of Session Equipment Utilized During Treatment: Gait belt Activity Tolerance: Patient tolerated treatment well Patient left: in chair;with call bell/phone within reach     Time: 1023-1043 PT Time Calculation (min) (ACUTE ONLY): 20 min  Charges:  $Gait Training:  8-22 mins                    G Codes:      Philomena Doheny 04/06/2016, 11:03 AM [phone]

## 2016-04-06 NOTE — Discharge Summary (Signed)
Patient ID: Jackson White MRN: [id number] DOB/AGE: 10-17-1970 45 y.o.  Admit date: 04/02/2016 Discharge date: 04/06/2016  Admission Diagnoses:  Principal Problem:   Osteoarthritis of left hip Active Problems:   Status post total replacement of left hip   Discharge Diagnoses:  Same  Past Medical History  Diagnosis Date  . Hypertension   . Pneumonia   . Prostatitis   . Arthritis     Surgeries: Procedure(s): LEFT TOTAL HIP ARTHROPLASTY ANTERIOR APPROACH on 04/02/2016   Consultants:    Discharged Condition: Improved  Hospital Course: Sulayman Manning Morozov is an 45 y.o. male who was admitted 04/02/2016 for operative treatment ofOsteoarthritis of left hip. Patient has severe unremitting pain that affects sleep, daily activities, and work/hobbies. After pre-op clearance the patient was taken to the operating room on 04/02/2016 and underwent  Procedure(s): LEFT TOTAL HIP ARTHROPLASTY ANTERIOR APPROACH.    Patient was given perioperative antibiotics: Anti-infectives    Start     Dose/Rate Route Frequency Ordered Stop   04/02/16 2000  ceFAZolin (ANCEF) IVPB 1 g/50 mL premix     1 g 100 mL/hr over 30 Minutes Intravenous Every 6 hours 04/02/16 1631 04/03/16 0255   04/02/16 1117  ceFAZolin (ANCEF) IVPB 2g/100 mL premix     2 g 200 mL/hr over 30 Minutes Intravenous On call to O.R. 04/02/16 1117 04/02/16 1342       Patient was given sequential compression devices, early ambulation, and chemoprophylaxis to prevent DVT.  His post-operative course was complicated by a spinal headache that did require a blood-patch by anesthesia.  Recent vital signs: Patient Vitals for the past 24 hrs:  BP Temp Temp src Pulse Resp SpO2  04/06/16 0613 (!) 115/59 mmHg 98.9 F (37.2 C) Oral 70 18 95 %  04/05/16 2125 125/69 mmHg 98.9 F (37.2 C) Oral 72 18 97 %  04/05/16 1400 (!) 106/56 mmHg 98.3 F (36.8 C) Oral 70 18 98 %  04/05/16 1111 133/83 mmHg 99.1 F (37.3 C) - 70 15 99 %  04/05/16 1105 - - -  69 14 99 %  04/05/16 1100 133/74 mmHg 98.2 F (36.8 C) - 70 15 99 %  04/05/16 1055 132/71 mmHg - - 70 14 99 %  04/05/16 1050 137/81 mmHg - - 77 13 99 %  04/05/16 1045 133/75 mmHg - - 71 16 99 %  04/05/16 1040 127/77 mmHg - - 70 14 99 %  04/05/16 1036 121/75 mmHg 98.2 F (36.8 C) - 71 - 99 %  04/05/16 1035 121/75 mmHg - - 67 13 99 %  04/05/16 1000 (!) 147/92 mmHg 98.2 F (36.8 C) - 86 19 96 %  04/05/16 0816 126/70 mmHg - - 65 - -     Recent laboratory studies: No results for input(s): WBC, HGB, HCT, PLT, NA, K, CL, CO2, BUN, CREATININE, GLUCOSE, INR, CALCIUM in the last 72 hours.  Invalid input(s): PT, 2   Discharge Medications:     Medication List    TAKE these medications        aspirin 325 MG EC tablet  Take 1 tablet (325 mg total) by mouth 2 (two) times daily after a meal.     clobetasol ointment 0.05 %  Commonly known as:  TEMOVATE  Apply 1 application topically 2 (two) times daily as needed.     desonide 0.05 % cream  Commonly known as:  DESOWEN  Apply 1 application topically 2 (two) times daily as needed (psoriasis).     ibuprofen 200  MG tablet  Commonly known as:  ADVIL,MOTRIN  Take 200 mg by mouth every 6 (six) hours as needed for headache or moderate pain.     methocarbamol 500 MG tablet  Commonly known as:  ROBAXIN  Take 1 tablet (500 mg total) by mouth every 6 (six) hours as needed for muscle spasms.     oxyCODONE-acetaminophen 5-325 MG tablet  Commonly known as:  ROXICET  Take 1-2 tablets by mouth every 4 (four) hours as needed.     propranolol ER 80 MG 24 hr capsule  Commonly known as:  INDERAL LA  Take 1 capsule by mouth daily.        Diagnostic Studies: Dg C-arm 1-60 Min-no Report  04/02/2016  CLINICAL DATA: surgery C-ARM 1-60 MINUTES Fluoroscopy was utilized by the requesting physician.  No radiographic interpretation.   Dg Hip Port Unilat With Pelvis 1v Left  04/02/2016  CLINICAL DATA:  Left hip arthroplasty EXAM: DG HIP (WITH OR WITHOUT  PELVIS) 1V PORT LEFT COMPARISON:  None. FINDINGS: Interval total left hip arthroplasty without failure or complication. No acute fracture or dislocation. Postsurgical changes in the surrounding soft tissues. There is an existing right hip resurfacing arthroplasty. IMPRESSION: Interval total left hip arthroplasty without failure or complication. Electronically Signed   By: Kathreen Devoid   On: 04/02/2016 15:52   Dg Hip Operative Unilat With Pelvis Left  04/02/2016  CLINICAL DATA:  Left hip replacement EXAM: OPERATIVE LEFT HIP WITH PELVIS COMPARISON:  None. FLUOROSCOPY TIME:  Radiation Exposure Index (as provided by the fluoroscopic device): Not available If the device does not provide the exposure index: Fluoroscopy Time:  24 seconds Number of Acquired Images:  2 FINDINGS: Left hip replacement is noted. No acute bony abnormality is seen. No soft tissue changes are noted. IMPRESSION: Status post left hip replacement Electronically Signed   By: Inez Catalina M.D.   On: 04/02/2016 14:51    Disposition: to home      Discharge Instructions    Call MD / Call 911    Complete by:  As directed   If you experience chest pain or shortness of breath, CALL 911 and be transported to the hospital emergency room.  If you develope a fever above 101 F, pus (white drainage) or increased drainage or redness at the wound, or calf pain, call your surgeon's office.     Constipation Prevention    Complete by:  As directed   Drink plenty of fluids.  Prune juice may be helpful.  You may use a stool softener, such as Colace (over the counter) 100 mg twice a day.  Use MiraLax (over the counter) for constipation as needed.     Diet - low sodium heart healthy    Complete by:  As directed      Discharge patient    Complete by:  As directed      Increase activity slowly as tolerated    Complete by:  As directed            Follow-up Information    Follow up with [facility].   Why:  home health physical therapy    Contact information:   [address] [place] [address] [phone]       Follow up with Mcarthur Rossetti, MD In 2 weeks.   Specialty:  Orthopedic Surgery   Contact information:   [address] [place] [address] [phone]        Signed: Mcarthur Rossetti  04/06/2016, 7:01 AM

## 2016-04-06 NOTE — Discharge Instructions (Signed)

## 2016-04-06 NOTE — Progress Notes (Signed)
CM received work from therapist pt WILL need both 3n1 and rolling walker as family not able to provide.  Cm called [facility] DME rep, Germaine to please deliver the DMe so pt can be discharged.

## 2016-04-06 NOTE — Evaluation (Signed)
Occupational Therapy Evaluation Patient Details Name: Jackson White MRN: [id number] DOB: 04/11/1971 Today's Date: 04/06/2016    History of Present Illness Pt is a 45 year old male s/p L THA, DA approach with hx of R THA   Clinical Impression   This 45 year old man was admitted for the above sx.  All education was completed. No further OT is needed at this time    Follow Up Recommendations  No OT follow up    Equipment Recommendations  3 in 1 bedside comode    Recommendations for Other Services       Precautions / Restrictions Precautions Precautions: None Restrictions Weight Bearing Restrictions: No Other Position/Activity Restrictions: WBAT      Mobility Bed Mobility            General bed mobility comments: oob  Transfers Overall transfer level: Modified independent Equipment used: Rolling walker (2 wheeled) Transfers: Sit to/from Stand Sit to Stand: Modified independent (Device/Increase time);From elevated surface              Balance Overall balance assessment: Modified Independent                                          ADL Overall ADL's : Needs assistance/impaired                         Toilet Transfer: Ambulation;Modified Independent (chair)             General ADL Comments: ambulated to bathrroom to practice shower transfer:  his shower at home is accessible, so he will not need to back into it.  Pt has AE from when he had other hip done (posterior approach) in '08.  Reviewed sock aide with him so that he will be successful with this. Reviewed working within pain tolerance.  Pt had no other concerns for OT     Vision     Perception     Praxis      Pertinent Vitals/Pain Pain Score: 2  Pain Location: L hip  Pain Descriptors / Indicators: Sore Pain Intervention(s): Limited activity within patient's tolerance;Monitored during session;Premedicated before session;Repositioned;Ice applied     Hand  Dominance     Extremity/Trunk Assessment Upper Extremity Assessment Upper Extremity Assessment: Overall WFL for tasks assessed           Communication Communication Communication: No difficulties   Cognition Arousal/Alertness: Awake/alert Behavior During Therapy: WFL for tasks assessed/performed Overall Cognitive Status: Within Functional Limits for tasks assessed                     General Comments       Exercises       Shoulder Instructions      Home Living Family/patient expects to be discharged to:: Private residence Living Arrangements: Spouse/significant other                 Bathroom Shower/Tub: Gaffer (accessible)   Biochemist, clinical: Standard         Additional Comments: had borrowed 3:1 last time from his Dad, but he needs his own now as father needs hissssss      Prior Functioning/Environment Level of Independence: Independent             OT Diagnosis: Acute pain   OT Problem List:     OT  Treatment/Interventions:      OT Goals(Current goals can be found in the care plan section) Acute Rehab OT Goals Patient Stated Goal: decrease pain and swelling  OT Frequency:     Barriers to D/C:            Co-evaluation              End of Session    Activity Tolerance: Patient tolerated treatment well Patient left: in chair;with call bell/phone within reach   Time: 4196-2229 OT Time Calculation (min): 10 min Charges:  OT General Charges $OT Visit: 1 Procedure OT Evaluation $OT Eval Low Complexity: 1 Procedure G-Codes:    Jackson White 2016-04-17, 12:53 PM Jackson White, OTR/L [phone] 04/17/2016

## 2016-04-06 NOTE — Progress Notes (Signed)
Patient ID: Jackson White, male   DOB: 12/17/1970, 45 y.o.   MRN: [id number] Feels better overall.  Blood patch yesterday due to severe spinal headache.  Slight headache this am, otherwise doing well.  Vitals stable.  Can be discharged to home today.

## 2016-06-28 ENCOUNTER — Ambulatory Visit (INDEPENDENT_AMBULATORY_CARE_PROVIDER_SITE_OTHER): Payer: BLUE CROSS/BLUE SHIELD | Admitting: Orthopaedic Surgery

## 2016-06-28 DIAGNOSIS — M1612 Unilateral primary osteoarthritis, left hip: Secondary | ICD-10-CM

## 2016-06-28 DIAGNOSIS — M25552 Pain in left hip: Secondary | ICD-10-CM

## 2016-09-27 ENCOUNTER — Ambulatory Visit (INDEPENDENT_AMBULATORY_CARE_PROVIDER_SITE_OTHER): Payer: Self-pay

## 2016-09-27 ENCOUNTER — Ambulatory Visit (INDEPENDENT_AMBULATORY_CARE_PROVIDER_SITE_OTHER): Payer: BLUE CROSS/BLUE SHIELD | Admitting: Orthopaedic Surgery

## 2016-09-27 DIAGNOSIS — Z96642 Presence of left artificial hip joint: Secondary | ICD-10-CM

## 2016-09-27 NOTE — Progress Notes (Signed)
Office Visit Note   Patient: KHAIDEN SEGRETO           Date of Birth: [birth year]           MRN: [id number] Visit Date: 09/27/2016              Requested by: Kirk Ruths, MD [facility] [place], [place] [address] PCP: Kirk Ruths., MD   Assessment & Plan: Visit Diagnoses:  1. History of total left hip arthroplasty     Plan: At this point I do not see him back for a year since he is doing well. I showed him stretching exercises for trochanteric bursitis and IT band pain. He only needs come back sooner exam any issues. At his 1 year follow-up now I would like just a low AP pelvis.  Follow-Up Instructions: Return in about 1 year (around 09/27/2017).   Orders:  Orders Placed This Encounter  Procedures  . XR HIP UNILAT W OR W/O PELVIS 1V RIGHT   No orders of the defined types were placed in this encounter.     Procedures: No procedures performed   Clinical Data: No additional findings.   Subjective: Chief Complaint  Patient presents with  . Left Hip - Follow-up    Patient states he is doing well. Good ROM and strength.    HPI He is 7 months status post a left total hip are to apply since it is doing well. Review of Systems   Objective: Vital Signs: There were no vitals taken for this visit.  Physical Exam He is alert and oriented 3 in no acute distress Ortho Exam His left hip has excellent range of motion no pain in the groin. He has a little bit of pain to palpation of the trochanteric area but it does not occur with motion. Specialty Comments:  No specialty comments available.  Imaging: Xr Hip Unilat W Or W/o Pelvis 1v Right  Result Date: 09/27/2016 An AP pelvis shows a well-seated implant on the right side from his remote hip replacement that was done about 10 years ago. His left hip that was done 7 months ago shows normal-appearing components in good alignment with no evidence of loosening or, getting  features.    PMFS History: Patient Active Problem List   Diagnosis Date Noted  . 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 Arthritis   . Hypertension   . Pneumonia   . Prostatitis     No family history on file.  Past Surgical History:  Procedure Laterality Date  . [place] hip resurface     right hip 2008  . CYST EXCISION    . skin cancer removed      right shoulder area 3 years ago   . TOTAL HIP ARTHROPLASTY Left 04/02/2016   Procedure: LEFT TOTAL HIP ARTHROPLASTY ANTERIOR APPROACH;  Surgeon: Mcarthur Rossetti, MD;  Location: [facility];  Service: Orthopedics;  Laterality: Left;   Social History   Occupational History  . Not on file.   Social History Main Topics  . Smoking status: Former Smoker    Packs/day: 0.25    Years: 12.00    Types: Cigarettes    Quit date: 09/20/2012  . Smokeless tobacco: Never Used  . Alcohol use Yes     Comment: 5 times per week   . Drug use: No  .  Sexual activity: Not on file

## 2017-09-28 ENCOUNTER — Ambulatory Visit (INDEPENDENT_AMBULATORY_CARE_PROVIDER_SITE_OTHER): Payer: Self-pay

## 2017-09-28 ENCOUNTER — Encounter (INDEPENDENT_AMBULATORY_CARE_PROVIDER_SITE_OTHER): Payer: Self-pay | Admitting: Orthopaedic Surgery

## 2017-09-28 ENCOUNTER — Ambulatory Visit (INDEPENDENT_AMBULATORY_CARE_PROVIDER_SITE_OTHER): Payer: BLUE CROSS/BLUE SHIELD | Admitting: Orthopaedic Surgery

## 2017-09-28 DIAGNOSIS — Z96642 Presence of left artificial hip joint: Secondary | ICD-10-CM | POA: Diagnosis not present

## 2017-09-28 NOTE — Progress Notes (Signed)
The patient is now over a year out from a left total hip arthroplasty through direct anterior approach.  He also has a history of a right [place] hip resurfacing arthroplasty that was done elsewhere.  He says overall he is very satisfied but he has had 5 different incidents where the hip has a catching sensation to him that makes him stop in his tracks.  This last occurred in October which is a little over 3 months ago.  He says since then things have calmed down.  His hips do not hurt at all and they do not have any issues on a day-to-day basis.  He does have these few incidents that had this catch that was worrisome.  Neither hip is ever dislocated.  He said this happened on the right side as well as the left side.  He denies any groin pain at all.  He denies any leg length discrepancy.  On exam both hips move fluidly and are full.  I could not reproduce any type of symptoms or mechanical problems with either hip.  The x-rays were obtained of his pelvis and his hip on the left side and the right side you can see on the AP view as well.  Neither hip shows any acute findings on x-ray in terms of ostial lysis or loosening.  At this standpoint he will follow-up as needed however if he continues to have any type of mechanical symptoms that worsen in any way he will let us know.  He feels like things are getting better significantly and he does not seem to have any issues and will let us know if this becomes problematic.

## 2017-10-05 ENCOUNTER — Encounter (INDEPENDENT_AMBULATORY_CARE_PROVIDER_SITE_OTHER): Payer: Self-pay | Admitting: Orthopaedic Surgery

## 2018-06-21 ENCOUNTER — Other Ambulatory Visit: Payer: Self-pay | Admitting: Internal Medicine

## 2018-06-21 ENCOUNTER — Ambulatory Visit
Admission: RE | Admit: 2018-06-21 | Discharge: 2018-06-21 | Disposition: A | Discharge status: Home / Self Care | Payer: BLUE CROSS/BLUE SHIELD | Source: Ambulatory Visit | Attending: Internal Medicine | Admitting: Internal Medicine

## 2018-06-21 DIAGNOSIS — R600 Localized edema: Secondary | ICD-10-CM | POA: Insufficient documentation

## 2019-02-21 ENCOUNTER — Ambulatory Visit: Payer: Self-pay

## 2019-02-21 ENCOUNTER — Ambulatory Visit (INDEPENDENT_AMBULATORY_CARE_PROVIDER_SITE_OTHER): Payer: PRIVATE HEALTH INSURANCE | Admitting: Orthopaedic Surgery

## 2019-02-21 ENCOUNTER — Other Ambulatory Visit: Payer: Self-pay

## 2019-02-21 DIAGNOSIS — M25561 Pain in right knee: Secondary | ICD-10-CM

## 2019-02-21 DIAGNOSIS — G8929 Other chronic pain: Secondary | ICD-10-CM

## 2019-02-21 MED ORDER — LIDOCAINE HCL 1 % IJ SOLN
3.0000 mL | INTRAMUSCULAR | Status: AC | PRN
  Administered 2019-02-21: 3 mL

## 2019-02-21 MED ORDER — METHYLPREDNISOLONE ACETATE 40 MG/ML IJ SUSP
40.0000 mg | INTRAMUSCULAR | Status: AC | PRN
  Administered 2019-02-21: 40 mg via INTRA_ARTICULAR

## 2019-02-21 NOTE — Progress Notes (Signed)
Office Visit Note   Patient: Jackson White           Date of Birth: 1971-06-10           MRN: [id number] Visit Date: 02/21/2019              Requested by: Kirk Ruths, MD [address] [place], [place] [address] PCP: Kirk Ruths, MD   Assessment & Plan: Visit Diagnoses:  1. Chronic pain of right knee     Plan: I was able to aspirate 40 cc of bloody fluid off of his knee which is certainly concerning.  This definitely warrants an MRI of his right knee to assess for any type of internal derangement or even assess the mass in the back of his knee.  Certainly this can be seen in severe synovitis and given his psoriasis he could have something going on his knee from that standpoint.  It is concerning enough that I do feel it is medically warranted to obtain an MRI of his right knee.  He agrees with this as well.  We will see him back once the MRI is obtained.  Follow-Up Instructions:  After MRI  Orders:  Orders Placed This Encounter  Procedures  . XR Knee 1-2 Views Right   No orders of the defined types were placed in this encounter.     Procedures: Large Joint Inj: R knee on 02/21/2019 5:43 PM Indications: diagnostic evaluation and pain Details: 22 G 1.5 in needle, superolateral approach  Arthrogram: No  Medications: 3 mL lidocaine 1 %; 40 mg methylPREDNISolone acetate 40 MG/ML Outcome: tolerated well, no immediate complications Procedure, treatment alternatives, risks and benefits explained, specific risks discussed. Consent was given by the patient. Immediately prior to procedure a time out was called to verify the correct patient, procedure, equipment, support staff and site/side marked as required. Patient was prepped and draped in the usual sterile fashion.       Clinical Data: No additional findings.   Subjective: Chief Complaint  Patient presents with  . Right Knee - Pain, Edema  The patient is well-known to me.   We have replaced his left hip several years ago.  He has also had a right hip replaced remotely.  He comes in with worsening right knee pain and swelling for several months now.  He developed quite extensive swelling even down into his calf with pitting edema that an ultrasound was obtained to rule out a DVT.  This was negative.  He does have a history of psoriasis but is never had psoriatic arthritis.  He is never injured this knee before.  He has had about 3 days recently of feeling better with his knee but is still remain swollen and he cannot flex it back all the way.  Denies any injury but is still concerned about the fullness in his knee and how full it feels in the back of the knee as well.  HPI  Review of Systems He currently denies any headache, chest pain, shortness of breath, fever, chills, nausea, vomiting  Objective: Vital Signs: There were no vitals taken for this visit.  Physical Exam Is alert and orient x3 and in no acute distress Ortho Exam Examination of his right knee does show a moderate knee joint effusion.  It is warm.  There is fullness in the back of his knee and I can palpate a mass almost as if it is a Baker's cyst or  another mass in the back of his knee.  It is hard to flex him past 90 degrees and is painful to do so. Specialty Comments:  No specialty comments available.  Imaging: Xr Knee 1-2 Views Right  Result Date: 02/21/2019 2 views of the right knee show well-maintained joint space.  There is no acute findings other than a moderate joint effusion and it is questionable whether a mass is seen in the soft tissue in the back of his knee.    PMFS History: Patient Active Problem List   Diagnosis Date Noted  . 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 Arthritis   . Hypertension   . Pneumonia   . Prostatitis     No family history on file.  Past Surgical History:   Procedure Laterality Date  . [place] hip resurface     right hip 2008  . CYST EXCISION    . skin cancer removed      right shoulder area 3 years ago   . TOTAL HIP ARTHROPLASTY Left 04/02/2016   Procedure: LEFT TOTAL HIP ARTHROPLASTY ANTERIOR APPROACH;  Surgeon: Mcarthur Rossetti, MD;  Location: [facility];  Service: Orthopedics;  Laterality: Left;   Social History   Occupational History  . Not on file  Tobacco Use  . Smoking status: Former Smoker    Packs/day: 0.25    Years: 12.00    Pack years: 3.00    Types: Cigarettes    Last attempt to quit: 09/20/2012    Years since quitting: 6.4  . Smokeless tobacco: Never Used  Substance and Sexual Activity  . Alcohol use: Yes    Comment: 5 times per week   . Drug use: No  . Sexual activity: Not on file

## 2019-02-22 ENCOUNTER — Other Ambulatory Visit: Payer: Self-pay

## 2019-02-22 DIAGNOSIS — S83242D Other tear of medial meniscus, current injury, left knee, subsequent encounter: Secondary | ICD-10-CM

## 2019-03-06 ENCOUNTER — Other Ambulatory Visit: Payer: Self-pay | Admitting: Orthopaedic Surgery

## 2019-03-06 DIAGNOSIS — S83242D Other tear of medial meniscus, current injury, left knee, subsequent encounter: Secondary | ICD-10-CM

## 2019-03-06 NOTE — Addendum Note (Signed)
Addended by: Daylene Posey T on: 03/06/2019 02:15 PM   Modules accepted: Orders

## 2019-03-12 ENCOUNTER — Other Ambulatory Visit: Payer: PRIVATE HEALTH INSURANCE

## 2019-03-20 ENCOUNTER — Other Ambulatory Visit: Payer: Self-pay

## 2019-03-20 ENCOUNTER — Ambulatory Visit
Admission: RE | Admit: 2019-03-20 | Discharge: 2019-03-20 | Disposition: A | Discharge status: Home / Self Care | Payer: PRIVATE HEALTH INSURANCE | Source: Ambulatory Visit | Attending: Orthopaedic Surgery | Admitting: Orthopaedic Surgery

## 2019-03-20 DIAGNOSIS — S83242D Other tear of medial meniscus, current injury, left knee, subsequent encounter: Secondary | ICD-10-CM | POA: Insufficient documentation

## 2019-03-27 ENCOUNTER — Ambulatory Visit: Payer: PRIVATE HEALTH INSURANCE | Admitting: Orthopaedic Surgery

## 2019-03-28 ENCOUNTER — Ambulatory Visit (INDEPENDENT_AMBULATORY_CARE_PROVIDER_SITE_OTHER): Payer: PRIVATE HEALTH INSURANCE | Admitting: Orthopaedic Surgery

## 2019-03-28 ENCOUNTER — Encounter: Payer: Self-pay | Admitting: Orthopaedic Surgery

## 2019-03-28 ENCOUNTER — Other Ambulatory Visit: Payer: Self-pay

## 2019-03-28 DIAGNOSIS — S83241A Other tear of medial meniscus, current injury, right knee, initial encounter: Secondary | ICD-10-CM | POA: Diagnosis not present

## 2019-03-28 NOTE — Progress Notes (Signed)
Jackson White comes in today to go over an MRI of his right knee.  I sent him for this MRI due to acute swelling in his right knee with an effusion and medial joint line tenderness.  He continues to have swelling and a recurrent effusion that right knee.  Pivoting activities causes severe amount of pain medially.  He has been having locking and catching as well.  This was an acute injury from a twisting aspect of things.  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  On exam again his knee is swollen today.  There is effusion.  He has medial joint line tenderness and a positive McMurray sign the medial compartment.  MRI is reviewed with him and it does show a complex medial meniscal tear that is an acute tear.  His cartilage is well-maintained throughout the knee joint itself.  There is a small Baker's cyst.  The ACL PCL and collateral ligaments are intact.  We are recommending an arthroscopic intervention with his right knee due to the recurrent effusion and the complexity of his meniscal tear with a flap component.  I had a long and thorough discussion about surgery with him.  We talked about the risk and benefits of surgery.  We talked about nonoperative treatment course as well.  He is been to work on Forensic scientist exercises as well.  I used a knee model explaining him what the surgery would involve and how to perform.  We went over his MRI in detail.  All question concerns were answered and addressed.  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  I talked to him about what his rehab would involve in follow-up as well.  He has her surgery scheduler's card.  We will work on getting this scheduled sometime in the next month or  2.

## 2019-07-31 ENCOUNTER — Other Ambulatory Visit: Payer: Self-pay

## 2019-08-02 ENCOUNTER — Other Ambulatory Visit: Payer: Self-pay | Admitting: Physician Assistant

## 2019-08-08 ENCOUNTER — Other Ambulatory Visit: Payer: Self-pay

## 2019-08-08 ENCOUNTER — Encounter (HOSPITAL_BASED_OUTPATIENT_CLINIC_OR_DEPARTMENT_OTHER): Payer: Self-pay | Admitting: *Deleted

## 2019-08-09 NOTE — Pre-Procedure Instructions (Signed)
Gave Ensure presurgery to patient's nephew with instructions to complete 3 hours before surgery start time, currently 9am completion for 12n surgery) Arrival time of 10:30 confirmed.

## 2019-08-10 ENCOUNTER — Other Ambulatory Visit (HOSPITAL_COMMUNITY)
Admission: RE | Admit: 2019-08-10 | Discharge: 2019-08-10 | Disposition: A | Discharge status: Home / Self Care | Payer: PRIVATE HEALTH INSURANCE | Source: Ambulatory Visit | Attending: Orthopaedic Surgery | Admitting: Orthopaedic Surgery

## 2019-08-10 DIAGNOSIS — Z20828 Contact with and (suspected) exposure to other viral communicable diseases: Secondary | ICD-10-CM | POA: Insufficient documentation

## 2019-08-10 DIAGNOSIS — Z01812 Encounter for preprocedural laboratory examination: Secondary | ICD-10-CM | POA: Diagnosis not present

## 2019-08-12 LAB — NOVEL CORONAVIRUS, NAA (HOSP ORDER, SEND-OUT TO REF LAB; TAT 18-24 HRS): SARS-CoV-2, NAA: NOT DETECTED

## 2019-08-13 ENCOUNTER — Other Ambulatory Visit (HOSPITAL_COMMUNITY): Payer: PRIVATE HEALTH INSURANCE

## 2019-08-14 ENCOUNTER — Ambulatory Visit (HOSPITAL_BASED_OUTPATIENT_CLINIC_OR_DEPARTMENT_OTHER): Payer: PRIVATE HEALTH INSURANCE | Admitting: Anesthesiology

## 2019-08-14 ENCOUNTER — Encounter (HOSPITAL_BASED_OUTPATIENT_CLINIC_OR_DEPARTMENT_OTHER)
Admission: RE | Disposition: A | Discharge status: Home / Self Care | Payer: Self-pay | Source: Home / Self Care | Attending: Orthopaedic Surgery

## 2019-08-14 ENCOUNTER — Ambulatory Visit (HOSPITAL_COMMUNITY)
Admission: RE | Admit: 2019-08-14 | Discharge: 2019-08-14 | Disposition: A | Discharge status: Home / Self Care | Payer: PRIVATE HEALTH INSURANCE | Attending: Orthopaedic Surgery | Admitting: Orthopaedic Surgery

## 2019-08-14 ENCOUNTER — Other Ambulatory Visit: Payer: Self-pay

## 2019-08-14 ENCOUNTER — Encounter (HOSPITAL_BASED_OUTPATIENT_CLINIC_OR_DEPARTMENT_OTHER): Payer: Self-pay | Admitting: *Deleted

## 2019-08-14 DIAGNOSIS — Z85828 Personal history of other malignant neoplasm of skin: Secondary | ICD-10-CM | POA: Insufficient documentation

## 2019-08-14 DIAGNOSIS — X58XXXA Exposure to other specified factors, initial encounter: Secondary | ICD-10-CM | POA: Insufficient documentation

## 2019-08-14 DIAGNOSIS — S83241A Other tear of medial meniscus, current injury, right knee, initial encounter: Secondary | ICD-10-CM | POA: Diagnosis present

## 2019-08-14 DIAGNOSIS — I10 Essential (primary) hypertension: Secondary | ICD-10-CM | POA: Insufficient documentation

## 2019-08-14 DIAGNOSIS — M659 Synovitis and tenosynovitis, unspecified: Secondary | ICD-10-CM | POA: Diagnosis not present

## 2019-08-14 DIAGNOSIS — Z96642 Presence of left artificial hip joint: Secondary | ICD-10-CM | POA: Diagnosis not present

## 2019-08-14 DIAGNOSIS — Z87891 Personal history of nicotine dependence: Secondary | ICD-10-CM | POA: Insufficient documentation

## 2019-08-14 DIAGNOSIS — S83231A Complex tear of medial meniscus, current injury, right knee, initial encounter: Secondary | ICD-10-CM | POA: Diagnosis not present

## 2019-08-14 HISTORY — DX: Other complications of anesthesia, initial encounter: T88.59XA

## 2019-08-14 HISTORY — PX: KNEE ARTHROSCOPY WITH MEDIAL MENISECTOMY: SHX5651

## 2019-08-14 SURGERY — ARTHROSCOPY, KNEE, WITH MEDIAL MENISCECTOMY
Anesthesia: General | Site: Knee | Laterality: Right

## 2019-08-14 MED ORDER — FENTANYL CITRATE (PF) 100 MCG/2ML IJ SOLN
INTRAMUSCULAR | Status: DC | PRN
  Administered 2019-08-14 (×3): 50 ug via INTRAVENOUS

## 2019-08-14 MED ORDER — HYDROMORPHONE HCL 1 MG/ML IJ SOLN
INTRAMUSCULAR | Status: AC
  Filled 2019-08-14: qty 0.5

## 2019-08-14 MED ORDER — SODIUM CHLORIDE 0.9 % IR SOLN
Status: DC | PRN
  Administered 2019-08-14: 3000 mL

## 2019-08-14 MED ORDER — DEXAMETHASONE SODIUM PHOSPHATE 10 MG/ML IJ SOLN
INTRAMUSCULAR | Status: DC | PRN
  Administered 2019-08-14: 10 mg via INTRAVENOUS

## 2019-08-14 MED ORDER — LIDOCAINE 2% (20 MG/ML) 5 ML SYRINGE
INTRAMUSCULAR | Status: AC
  Filled 2019-08-14: qty 5

## 2019-08-14 MED ORDER — CHLORHEXIDINE GLUCONATE 4 % EX LIQD: 60.0000 mL | Freq: Once | CUTANEOUS | Status: DC

## 2019-08-14 MED ORDER — CEFAZOLIN SODIUM-DEXTROSE 2-4 GM/100ML-% IV SOLN
2.0000 g | INTRAVENOUS | Status: AC
  Administered 2019-08-14: 2 g via INTRAVENOUS

## 2019-08-14 MED ORDER — LACTATED RINGERS IV SOLN
INTRAVENOUS | Status: DC
  Administered 2019-08-14: 11:00:00 via INTRAVENOUS

## 2019-08-14 MED ORDER — MORPHINE SULFATE (PF) 4 MG/ML IV SOLN
INTRAVENOUS | Status: AC
  Filled 2019-08-14: qty 1

## 2019-08-14 MED ORDER — MIDAZOLAM HCL 5 MG/5ML IJ SOLN
INTRAMUSCULAR | Status: DC | PRN
  Administered 2019-08-14: 2 mg via INTRAVENOUS

## 2019-08-14 MED ORDER — LIDOCAINE 2% (20 MG/ML) 5 ML SYRINGE
INTRAMUSCULAR | Status: DC | PRN
  Administered 2019-08-14: 60 mg via INTRAVENOUS

## 2019-08-14 MED ORDER — DIPHENHYDRAMINE HCL 50 MG/ML IJ SOLN
INTRAMUSCULAR | Status: DC | PRN
  Administered 2019-08-14: 25 mg via INTRAVENOUS

## 2019-08-14 MED ORDER — PROMETHAZINE HCL 25 MG/ML IJ SOLN: 6.2500 mg | INTRAMUSCULAR | Status: DC | PRN

## 2019-08-14 MED ORDER — OXYCODONE HCL 5 MG PO TABS: 5.0000 mg | ORAL_TABLET | Freq: Four times a day (QID) | ORAL | 0 refills | Status: AC | PRN

## 2019-08-14 MED ORDER — MIDAZOLAM HCL 2 MG/2ML IJ SOLN
INTRAMUSCULAR | Status: AC
  Filled 2019-08-14: qty 2

## 2019-08-14 MED ORDER — METHYLPREDNISOLONE ACETATE 40 MG/ML IJ SUSP
INTRAMUSCULAR | Status: DC | PRN
  Administered 2019-08-14: 40 mg via INTRA_ARTICULAR

## 2019-08-14 MED ORDER — OXYCODONE HCL 5 MG/5ML PO SOLN: 5.0000 mg | Freq: Once | ORAL | Status: AC | PRN

## 2019-08-14 MED ORDER — SILVER NITRATE-POT NITRATE 75-25 % EX MISC
CUTANEOUS | Status: AC
  Filled 2019-08-14: qty 10

## 2019-08-14 MED ORDER — FENTANYL CITRATE (PF) 100 MCG/2ML IJ SOLN
INTRAMUSCULAR | Status: AC
  Filled 2019-08-14: qty 2

## 2019-08-14 MED ORDER — LIDOCAINE HCL (PF) 1 % IJ SOLN
INTRAMUSCULAR | Status: AC
  Filled 2019-08-14: qty 30

## 2019-08-14 MED ORDER — FENTANYL CITRATE (PF) 100 MCG/2ML IJ SOLN: 50.0000 ug | INTRAMUSCULAR | Status: DC | PRN

## 2019-08-14 MED ORDER — MORPHINE SULFATE (PF) 4 MG/ML IV SOLN
INTRAVENOUS | Status: DC | PRN
  Administered 2019-08-14: 4 mg

## 2019-08-14 MED ORDER — MIDAZOLAM HCL 2 MG/2ML IJ SOLN: 1.0000 mg | INTRAMUSCULAR | Status: DC | PRN

## 2019-08-14 MED ORDER — DIPHENHYDRAMINE HCL 50 MG/ML IJ SOLN
INTRAMUSCULAR | Status: AC
  Filled 2019-08-14: qty 1

## 2019-08-14 MED ORDER — ONDANSETRON HCL 4 MG/2ML IJ SOLN
INTRAMUSCULAR | Status: AC
  Filled 2019-08-14: qty 2

## 2019-08-14 MED ORDER — BUPIVACAINE HCL (PF) 0.5 % IJ SOLN
INTRAMUSCULAR | Status: AC
  Filled 2019-08-14: qty 30

## 2019-08-14 MED ORDER — PROPOFOL 10 MG/ML IV BOLUS
INTRAVENOUS | Status: DC | PRN
  Administered 2019-08-14: 200 mg via INTRAVENOUS

## 2019-08-14 MED ORDER — METHYLPREDNISOLONE ACETATE 40 MG/ML IJ SUSP
INTRAMUSCULAR | Status: AC
  Filled 2019-08-14: qty 1

## 2019-08-14 MED ORDER — CEFAZOLIN SODIUM-DEXTROSE 2-4 GM/100ML-% IV SOLN
INTRAVENOUS | Status: AC
  Filled 2019-08-14: qty 100

## 2019-08-14 MED ORDER — DEXAMETHASONE SODIUM PHOSPHATE 10 MG/ML IJ SOLN
INTRAMUSCULAR | Status: AC
  Filled 2019-08-14: qty 1

## 2019-08-14 MED ORDER — OXYCODONE HCL 5 MG PO TABS
5.0000 mg | ORAL_TABLET | Freq: Once | ORAL | Status: AC | PRN
  Administered 2019-08-14: 5 mg via ORAL

## 2019-08-14 MED ORDER — ONDANSETRON HCL 4 MG/2ML IJ SOLN
INTRAMUSCULAR | Status: DC | PRN
  Administered 2019-08-14: 4 mg via INTRAVENOUS

## 2019-08-14 MED ORDER — BUPIVACAINE HCL (PF) 0.25 % IJ SOLN
INTRAMUSCULAR | Status: AC
  Filled 2019-08-14: qty 30

## 2019-08-14 MED ORDER — HYDROMORPHONE HCL 1 MG/ML IJ SOLN
0.2500 mg | INTRAMUSCULAR | Status: DC | PRN
  Administered 2019-08-14 (×2): 0.5 mg via INTRAVENOUS

## 2019-08-14 MED ORDER — OXYCODONE HCL 5 MG PO TABS
ORAL_TABLET | ORAL | Status: AC
  Filled 2019-08-14: qty 1

## 2019-08-14 MED ORDER — BUPIVACAINE HCL (PF) 0.5 % IJ SOLN
INTRAMUSCULAR | Status: DC | PRN
  Administered 2019-08-14: 20 mL

## 2019-08-14 SURGICAL SUPPLY — 36 items
BLADE EXCALIBUR 4.0X13 (MISCELLANEOUS) IMPLANT
BNDG ELASTIC 6X5.8 VLCR STR LF (GAUZE/BANDAGES/DRESSINGS) ×2 IMPLANT
COVER WAND RF STERILE (DRAPES) IMPLANT
DISSECTOR  3.8MM X 13CM (MISCELLANEOUS) ×1
DISSECTOR 3.8MM X 13CM (MISCELLANEOUS) IMPLANT
DRAPE ARTHROSCOPY W/POUCH 90 (DRAPES) ×2 IMPLANT
DRAPE U-SHAPE 47X51 STRL (DRAPES) ×2 IMPLANT
DRSG PAD ABDOMINAL 8X10 ST (GAUZE/BANDAGES/DRESSINGS) ×2 IMPLANT
DURAPREP 26ML APPLICATOR (WOUND CARE) ×2 IMPLANT
ELECT MENISCUS 165MM 90D (ELECTRODE) IMPLANT
ELECT REM PT RETURN 9FT ADLT (ELECTROSURGICAL)
ELECTRODE REM PT RTRN 9FT ADLT (ELECTROSURGICAL) IMPLANT
EXCALIBUR 3.8MM X 13CM (MISCELLANEOUS) IMPLANT
GAUZE SPONGE 4X4 12PLY STRL (GAUZE/BANDAGES/DRESSINGS) ×2 IMPLANT
GAUZE XEROFORM 1X8 LF (GAUZE/BANDAGES/DRESSINGS) ×2 IMPLANT
GLOVE BIOGEL PI IND STRL 7.0 (GLOVE) IMPLANT
GLOVE BIOGEL PI IND STRL 8 (GLOVE) ×2 IMPLANT
GLOVE BIOGEL PI INDICATOR 7.0 (GLOVE) ×3
GLOVE BIOGEL PI INDICATOR 8 (GLOVE) ×2
GLOVE ECLIPSE 6.5 STRL STRAW (GLOVE) ×2 IMPLANT
GLOVE ORTHO TXT STRL SZ7.5 (GLOVE) ×2 IMPLANT
GLOVE SURG ORTHO 8.0 STRL STRW (GLOVE) ×2 IMPLANT
GOWN STRL REUS W/ TWL LRG LVL3 (GOWN DISPOSABLE) ×2 IMPLANT
GOWN STRL REUS W/ TWL XL LVL3 (GOWN DISPOSABLE) ×1 IMPLANT
GOWN STRL REUS W/TWL LRG LVL3 (GOWN DISPOSABLE) ×3
GOWN STRL REUS W/TWL XL LVL3 (GOWN DISPOSABLE) ×1
KNEE WRAP E Z 3 GEL PACK (MISCELLANEOUS) ×2 IMPLANT
MANIFOLD NEPTUNE II (INSTRUMENTS) ×1 IMPLANT
PACK ARTHROSCOPY DSU (CUSTOM PROCEDURE TRAY) ×2 IMPLANT
PACK BASIN DAY SURGERY FS (CUSTOM PROCEDURE TRAY) ×2 IMPLANT
PADDING CAST COTTON 6X4 STRL (CAST SUPPLIES) ×2 IMPLANT
PENCIL SMOKE EVACUATOR (MISCELLANEOUS) IMPLANT
SUT ETHILON 3 0 PS 1 (SUTURE) ×2 IMPLANT
TOWEL GREEN STERILE FF (TOWEL DISPOSABLE) ×2 IMPLANT
TUBING ARTHROSCOPY IRRIG 16FT (MISCELLANEOUS) ×2 IMPLANT
WATER STERILE IRR 1000ML POUR (IV SOLUTION) ×1 IMPLANT

## 2019-08-14 NOTE — Brief Op Note (Signed)
08/14/2019  12:40 PM  PATIENT:  Jackson White  48 y.o. male  PRE-OPERATIVE DIAGNOSIS:  Acute right knee medial meniscal tear  POST-OPERATIVE DIAGNOSIS:  Acute right knee medial meniscal tear  PROCEDURE:  Procedure(s): RIGHT KNEE ARTHROSCOPY WITH PARTIAL MEDIAL MENISECTOMY (Right)  SURGEON:  Surgeon(s) and Role:    Mcarthur Rossetti, MD - Primary  PHYSICIAN ASSISTANT: Benita Stabile, PA-C  ANESTHESIA:   local and general  EBL:  5 mL   COUNTS:  YES  DICTATION: .Other Dictation: Dictation Number [id number]  PLAN OF CARE: Discharge to home after PACU  PATIENT DISPOSITION:  PACU - hemodynamically stable.   Delay start of Pharmacological VTE agent (>24hrs) due to surgical blood loss or risk of bleeding: no

## 2019-08-14 NOTE — H&P (Signed)
I have seen and examined the patient.  He understands fully that we are proceeding to surgery today for a right knee arthroscopy with a partial medial meniscectomy.  All question concerns were answered and addressed.  His right knee has been marked and informed consent is obtained.  There is been no acute change in medical status.  See recent H&P.

## 2019-08-14 NOTE — Anesthesia Procedure Notes (Signed)
Procedure Name: LMA Insertion Date/Time: 08/14/2019 11:57 AM Performed by: Gwyndolyn Saxon, CRNA Pre-anesthesia Checklist: Patient identified, Emergency Drugs available, Suction available and Patient being monitored Patient Re-evaluated:Patient Re-evaluated prior to induction Oxygen Delivery Method: Circle system utilized Preoxygenation: Pre-oxygenation with 100% oxygen Induction Type: IV induction Ventilation: Mask ventilation without difficulty LMA: LMA inserted LMA Size: 4.0 Number of attempts: 1 Placement Confirmation: positive ETCO2 and breath sounds checked- equal and bilateral Tube secured with: Tape Dental Injury: Teeth and Oropharynx as per pre-operative assessment

## 2019-08-14 NOTE — Anesthesia Preprocedure Evaluation (Signed)
Anesthesia Evaluation  Patient identified by MRN, date of birth, ID band Patient awake    Reviewed: Allergy & Precautions, NPO status , Patient's Chart, lab work & pertinent test results  History of Anesthesia Complications (+) history of anesthetic complications  Airway Mallampati: II  TM Distance: >3 FB Neck ROM: Full    Dental  (+) Teeth Intact   Pulmonary neg shortness of breath, neg sleep apnea, neg COPD, neg recent URI, former smoker,    breath sounds clear to auscultation       Cardiovascular hypertension, Pt. 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  negative psych ROS   GI/Hepatic negative GI ROS, Neg liver ROS,   Endo/Other  negative endocrine ROS  Renal/GU negative Renal ROS     Musculoskeletal  (+) Arthritis ,   Abdominal   Peds  Hematology  (+) Blood dyscrasia, anemia ,   Anesthesia Other Findings   Reproductive/Obstetrics                             Anesthesia Physical  Anesthesia Plan  ASA: II  Anesthesia Plan: General   Post-op Pain Management:    Induction: Intravenous  PONV Risk Score and Plan: 2 and Ondansetron, Midazolam and Treatment may vary due to age or medical condition  Airway Management Planned: LMA  Additional Equipment: None  Intra-op Plan:   Post-operative Plan:   Informed Consent: I have reviewed the patients History and Physical, chart, labs and discussed the procedure including the risks, benefits and alternatives for the proposed anesthesia with the patient or authorized representative who has indicated his/her understanding and acceptance.     Dental advisory given  Plan Discussed with: CRNA and Surgeon  Anesthesia Plan Comments:         Anesthesia Quick Evaluation

## 2019-08-14 NOTE — Anesthesia Postprocedure Evaluation (Signed)
Anesthesia Post Note  Patient: Jackson White  Procedure(s) Performed: RIGHT KNEE ARTHROSCOPY WITH PARTIAL MEDIAL MENISECTOMY (Right Knee)     Patient location during evaluation: PACU Anesthesia Type: General Level of consciousness: awake and alert Pain management: pain level controlled Vital Signs Assessment: post-procedure vital signs reviewed and stable Respiratory status: spontaneous breathing, nonlabored ventilation and respiratory function stable Cardiovascular status: blood pressure returned to baseline and stable Postop Assessment: no apparent nausea or vomiting Anesthetic complications: no    Last Vitals:  Vitals:   08/14/19 1345 08/14/19 1412  BP: 137/88 (!) 159/86  Pulse: 79 78  Resp: 12 18  Temp:  (!) 36.2 C  SpO2: 96% 98%    Last Pain:  Vitals:   08/14/19 1412  TempSrc:   PainSc: [place]

## 2019-08-14 NOTE — Discharge Instructions (Signed)
You may increase your activities as comfort allows. Do expect right knee swelling - ice and elevation as needed. You may put all of your weight on your right knee. You can remove your dressings in 24 hours and get your incisions wet in the shower. Place band-aides over your incisions daily. Do expect some bloody drainage.   Post Anesthesia Home Care Instructions  Activity: Get plenty of rest for the remainder of the day. A responsible individual must stay with you for 24 hours following the procedure.  For the next 24 hours, DO NOT: -Drive a car -Paediatric nurse -Drink alcoholic beverages -Take any medication unless instructed by your physician -Make any legal decisions or sign important papers.  Meals: Start with liquid foods such as gelatin or soup. Progress to regular foods as tolerated. Avoid greasy, spicy, heavy foods. If nausea and/or vomiting occur, drink only clear liquids until the nausea and/or vomiting subsides. Call your physician if vomiting continues.  Special Instructions/Symptoms: Your throat may feel dry or sore from the anesthesia or the breathing tube placed in your throat during surgery. If this causes discomfort, gargle with warm salt water. The discomfort should disappear within 24 hours.  If you had a scopolamine patch placed behind your ear for the management of post- operative nausea and/or vomiting:  1. The medication in the patch is effective for 72 hours, after which it should be removed.  Wrap patch in a tissue and discard in the trash. Wash hands thoroughly with soap and water. 2. You may remove the patch earlier than 72 hours if you experience unpleasant side effects which may include dry mouth, dizziness or visual disturbances. 3. Avoid touching the patch. Wash your hands with soap and water after contact with the patch.

## 2019-08-14 NOTE — Op Note (Signed)
Jackson White, Jackson White MEDICAL RECORD [id number] ACCOUNT [id number] DATE OF BIRTH:Feb 21, 1971 FACILITY: [facility] LOCATION: [facility] PHYSICIAN:Porsche Noguchi Kerry Fort, MD  OPERATIVE REPORT  DATE OF PROCEDURE:  08/14/2019  PREOPERATIVE DIAGNOSES:   1.  Right knee symptomatic medial meniscal tear. 2.  Synovitis, right knee.  POSTOPERATIVE DIAGNOSES:   1.  Right knee symptomatic medial meniscal tear. 2.  Synovitis, right knee.  PROCEDURE:   1.  Right knee arthroscopy with partial medial meniscectomy. 2.  Partial synovectomy, right knee.  FINDINGS:   1.  Complex medial meniscal tear, right knee. 2.  Synovitis in all 3 compartments and intercondylar area of the knee.  SURGEON:  Lind Guest. Ninfa Linden, MD  ASSISTANT:  Erskine Emery, PA-C  ANESTHESIA:   1.  General. 2.  Local with a mixture of 0.5% plain Marcaine, 1 mg of Depo-Medrol, 1 mg morphine.  ESTIMATED BLOOD LOSS:  Minimal.  COMPLICATIONS:  None.  INDICATIONS:  The patient is a 48 year old gentleman well known to me.  He has been dealing with right knee recurrent effusions for some time now.  We have aspirated his knee several times and provided steroid injection in the knee.  Eventually, we did  obtain an MRI of that right knee that showed intact cartilage throughout the knee, but a complex medial meniscal tear and synovitis in the knee.  At this point, with the failure of conservative treatment, we did recommended arthroscopic intervention of  his knee.  He did agree to proceed with this.  DESCRIPTION OF PROCEDURE:  After informed consent was obtained and appropriate right knee was marked, he was brought to the operating room and placed on the operating table.  General anesthesia was then obtained.  His right knee, thigh, leg and ankle  were prepped and draped with DuraPrep and sterile drapes including a sterile stockinette with a lateral leg post utilized and the bed raised and the right, operative knee was flexed  off the side table.  A time-out was called to identify correct patient  and correct right knee.  I then made an anterior lateral arthroscopy portal and inserted a cannula knee and drained a large effusion from the knee.  I went to the medial compartment after placing a camera in the knee and did make an anterior medial  arthroscopy portal.  We did find significant synovitis in all 3 compartments of the knee.  There was a complex posterior horn medial meniscal tear which we used an arthroscopic shaver to perform a partial medial meniscectomy, debriding this back to a  stable margin.  I then used an arthroscopic shaver to perform a partial synovectomy in all 3 compartments of the knee and intercondylar area.  His cartilage was intact throughout the knee, but he had significant synovitis.  I then allowed fluid lavage of  the knee and drained all the fluid from the knee.  We closed the portal sites with interrupted nylon suture.  We did insert a mixture of Depo-Medrol, morphine and 0.5% plain Marcaine into the knee joint.  Xeroform well-padded sterile dressing was  applied.  He was awakened, extubated, and taken to recovery room in stable condition.  All final counts were correct.  There were no complications noted.  Postoperatively, we allow him to weight bear as tolerated with increasing activity as tolerated.   We will see him back in the office in a week.  TN/NUANCE  D:08/14/2019 T:08/14/2019 JOB:[id number]

## 2019-08-14 NOTE — H&P (Signed)
Jackson White is an 48 y.o. male.   Chief Complaint: Right knee with acute medial meniscal tear HPI: The patient is a 48 year old gentleman well-known to me.  He has been dealing with recurrent effusions and swelling of his right knee with locking and catching for the last several months.  In the summer we did obtain a MRI of his right knee.  It did show a complex medial meniscal tear with intact cartilage in the knee.  The other ligament structures and they were intact.  We have drained fluid from his knee and provide steroid injections.  Due to his continued mechanical symptoms continued recurrent effusions he is decided on arthroscopic intervention for the right knee and we have recommended this as well.  Right knee acute complex medial meniscal tear  Past Medical History:  Diagnosis Date  . Arthritis   . Complication of anesthesia    spinal leak following hip surgery  . Hypertension   . Pneumonia   . Prostatitis     Past Surgical History:  Procedure Laterality Date  . [place] hip resurface     right hip 2008  . CYST EXCISION    . skin cancer removed      right shoulder area 3 years ago   . TOTAL HIP ARTHROPLASTY Left 04/02/2016   Procedure: LEFT TOTAL HIP ARTHROPLASTY ANTERIOR APPROACH;  Surgeon: Mcarthur Rossetti, MD;  Location: [facility];  Service: Orthopedics;  Laterality: Left;    History reviewed. No pertinent family history. Social History:  reports that he quit smoking about 6 years ago. His smoking use included cigarettes. He has a 3.00 pack-year smoking history. He has never used smokeless tobacco. He reports current alcohol use. He reports that he does not use drugs.  Allergies:  Allergies  Allergen Reactions  . Hydrocodone-Acetaminophen Itching  . Meloxicam Itching  . Meperidine Itching    No medications prior to admission.    No results found for this or any previous visit (from the past 48 hour(s)). No results found.  Review of Systems  All other  systems reviewed and are negative.   Height 6' 1"  (1.854 m), weight 97.5 kg. Physical Exam  Constitutional: He is oriented to person, place, and time. He appears well-developed and well-nourished.  HENT:  Head: Normocephalic and atraumatic.  Eyes: Pupils are equal, round, and reactive to light. EOM are normal.  Neck: Normal range of motion. Neck supple.  Cardiovascular: Normal rate.  Respiratory: Effort normal.  GI: Soft.  Musculoskeletal:     Right knee: He exhibits swelling and abnormal meniscus. Tenderness found. Medial joint line tenderness noted.  Neurological: He is alert and oriented to person, place, and time.  Skin: Skin is warm and dry.  Psychiatric: He has a normal mood and affect.     Assessment/Plan Right knee with complex acute medial meniscal tear  We will proceed to the operating room today for a right knee arthroscopy with debridement and partial medial meniscectomy.  The risk and benefits of surgery have been explained in detail and informed consent will be obtained.  Mcarthur Rossetti, MD 08/14/2019, 7:41 AM

## 2019-08-14 NOTE — Transfer of Care (Signed)
Immediate Anesthesia Transfer of Care Note  Patient: Jackson White  Procedure(s) Performed: RIGHT KNEE ARTHROSCOPY WITH PARTIAL MEDIAL MENISECTOMY (Right Knee)  Patient Location: PACU  Anesthesia Type:General  Level of Consciousness: drowsy and patient cooperative  Airway & Oxygen Therapy: Patient Spontanous Breathing and Patient connected to face mask oxygen  Post-op Assessment: Report given to RN and Post -op Vital signs reviewed and stable  Post vital signs: Reviewed and stable  Last Vitals:  Vitals Value Taken Time  BP 142/89 08/14/19 1316  Temp    Pulse 64 08/14/19 1321  Resp 12 08/14/19 1321  SpO2 95 % 08/14/19 1321  Vitals shown include unvalidated device data.  Last Pain:  Vitals:   08/14/19 1316  TempSrc:   PainSc: 4       Patients Stated Pain Goal: 1 (67/20/94 7096)  Complications: No apparent anesthesia complications

## 2019-08-17 ENCOUNTER — Encounter (HOSPITAL_BASED_OUTPATIENT_CLINIC_OR_DEPARTMENT_OTHER): Payer: Self-pay | Admitting: Orthopaedic Surgery

## 2019-08-20 ENCOUNTER — Ambulatory Visit (INDEPENDENT_AMBULATORY_CARE_PROVIDER_SITE_OTHER): Payer: PRIVATE HEALTH INSURANCE | Admitting: Orthopaedic Surgery

## 2019-08-20 ENCOUNTER — Encounter: Payer: Self-pay | Admitting: Orthopaedic Surgery

## 2019-08-20 ENCOUNTER — Other Ambulatory Visit: Payer: Self-pay

## 2019-08-20 DIAGNOSIS — Z9889 Other specified postprocedural states: Secondary | ICD-10-CM

## 2019-08-20 NOTE — Progress Notes (Signed)
The patient is 1 week status post a right knee arthroscopy with partial medial meniscectomy.  He had significant synovitis in his knee as well.  He does feel better since surgery and he is having just a little bit of swelling.  He does have a history of psoriasis.  I believe that a lot of his synovitis was related to that.  We found intact cartilage throughout the knee.  I did go over his arthroscopy pictures with him.  There is no significant effusion of his right knee today.  Of note we did place a steroid injection in his knee at the end of his case.  His suture lines look good to remove the sutures.  He has good range of motion of his knee and his calf is soft.  He will continue to increase his activities as comfort allows.  I would like to see him back in about 6 weeks to see how he is doing overall.  I would be comfortable placing 1 more steroid injection in his knee then if he has a buildup of fluid with synovitis.  We may end up needing to make a referral to rheumatology but will hold on that until we see him back.  All questions concerns were answered and addressed.

## 2019-08-23 ENCOUNTER — Encounter: Payer: Self-pay | Admitting: Orthopaedic Surgery

## 2019-10-01 ENCOUNTER — Ambulatory Visit (INDEPENDENT_AMBULATORY_CARE_PROVIDER_SITE_OTHER): Payer: PRIVATE HEALTH INSURANCE | Admitting: Orthopaedic Surgery

## 2019-10-01 ENCOUNTER — Other Ambulatory Visit: Payer: Self-pay

## 2019-10-01 ENCOUNTER — Encounter: Payer: Self-pay | Admitting: Orthopaedic Surgery

## 2019-10-01 DIAGNOSIS — Z9889 Other specified postprocedural states: Secondary | ICD-10-CM

## 2019-10-01 NOTE — Progress Notes (Signed)
Jackson White is now 6 weeks status post a right knee arthroscopy with a partial medial meniscectomy.  He says he is much better now than he was before.  He is not 100% but his knee is not having a recurrent effusion like he did before.  He does have a history of a left total hip arthroplasty that we performed in 2017.  He also has a right hip resurfacing arthroplasty that was done elsewhere remotely.  He has no problems with his hips right now.  On examination of his right operative knee there is no effusion today.  His range of motion is full.  There is no significant crepitation.  On my standpoint we do not need to see him back for 6 months unless there are issues.  At that visit I would like a low AP pelvis and lateral of both hips.  We may even have his metal ion levels checked then since he does have the [place] hip on the right side that is a metal-on-metal articulation.  All questions concerns were otherwise answered and addressed.  If there is any issues before 6 months from now he will let us know.

## 2020-03-31 ENCOUNTER — Encounter: Payer: Self-pay | Admitting: Orthopaedic Surgery

## 2020-03-31 ENCOUNTER — Ambulatory Visit (INDEPENDENT_AMBULATORY_CARE_PROVIDER_SITE_OTHER): Payer: PRIVATE HEALTH INSURANCE

## 2020-03-31 ENCOUNTER — Ambulatory Visit (INDEPENDENT_AMBULATORY_CARE_PROVIDER_SITE_OTHER): Payer: PRIVATE HEALTH INSURANCE | Admitting: Orthopaedic Surgery

## 2020-03-31 ENCOUNTER — Other Ambulatory Visit: Payer: Self-pay

## 2020-03-31 DIAGNOSIS — Z96643 Presence of artificial hip joint, bilateral: Secondary | ICD-10-CM | POA: Diagnosis not present

## 2020-03-31 NOTE — Progress Notes (Signed)
Office Visit Note   Patient: Jackson White           Date of Birth: March 13, 1971           MRN: [id number] Visit Date: 03/31/2020              Requested by: Kirk Ruths, MD [place],  [place] [address] PCP: Kirk Ruths, MD   Assessment & Plan: Visit Diagnoses:  1. History of bilateral hip replacements     Plan: I gave him reassurance that both hips look great.  At this point follow-up can be as needed since he is doing so well.  If he develops any type of dull aching pain around either hip he knows to let us know.  All questions and concerns were answered and addressed.  Follow-Up Instructions: Return if symptoms worsen or fail to improve.   Orders:  Orders Placed This Encounter  Procedures  . XR HIPS BILAT W OR W/O PELVIS 3-4 VIEWS   No orders of the defined types were placed in this encounter.     Procedures: No procedures performed   Clinical Data: No additional findings.   Subjective: Chief Complaint  Patient presents with  . Right Knee - Follow-up  . Right Hip - Follow-up  . Left Hip - Follow-up  The patient is here for 3 different things.  1 is follow-up for having hip replacements.  We replaced his right hip with an anterior hip replacement and 2017.  He had a right metal-on-metal [place] hip replacement done elsewhere and I believe 2008.  Both hips are doing well and have no problems at all.  He did have metal ion levels checked at [facility] in 2019 which were normal.  We have also recently performed arthroscopic surgery on his right knee in November 2020.  He does occasionally he has some weakness in the knee but overall there is been no swelling or discomfort.  He said no other acute changes in medical status  HPI  Review of Systems He currently denies any headache, chest pain, shortness of breath, fever, chills, nausea, vomiting  Objective: Vital Signs: There were no vitals taken for this  visit.  Physical Exam He is alert and orient x3 and in no acute distress Ortho Exam Examination of both hips shows full and fluid range of motion of both hips.  His right knee also has excellent range of motion and is ligamentously stable.  His McMurray's exam is negative and there is no knee effusion. Specialty Comments:  No specialty comments available.  Imaging: XR HIPS BILAT W OR W/O PELVIS 3-4 VIEWS  Result Date: 03/31/2020 An AP pelvis and lateral both hips shows well-seated total hip arthroplasties.  The right side is a [place] metal-on-metal hip replacement.  Neither side says any evidence of loosening.  There is no osteolysis around either hip components.    PMFS History: Patient Active Problem List   Diagnosis Date Noted  . Acute medial meniscus tear, right, initial encounter 03/28/2019  . 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 Arthritis   . Complication of anesthesia    spinal leak following hip surgery  . Hypertension   . Pneumonia   . Prostatitis     History reviewed. No pertinent family history.  Past Surgical History:  Procedure Laterality Date  . [place]  hip resurface     right hip 2008  . CYST EXCISION    . KNEE ARTHROSCOPY WITH MEDIAL MENISECTOMY Right 08/14/2019   Procedure: RIGHT KNEE ARTHROSCOPY WITH PARTIAL MEDIAL MENISECTOMY;  Surgeon: Mcarthur Rossetti, MD;  Location: [facility];  Service: Orthopedics;  Laterality: Right;  . skin cancer removed      right shoulder area 3 years ago   . TOTAL HIP ARTHROPLASTY Left 04/02/2016   Procedure: LEFT TOTAL HIP ARTHROPLASTY ANTERIOR APPROACH;  Surgeon: Mcarthur Rossetti, MD;  Location: [facility];  Service: Orthopedics;  Laterality: Left;   Social History   Occupational History  . Not on file  Tobacco Use  . Smoking status: Former Smoker    Packs/day: 0.25    Years: 12.00     Pack years: 3.00    Types: Cigarettes    Quit date: 09/20/2012    Years since quitting: 7.5  . Smokeless tobacco: Never Used  Substance and Sexual Activity  . Alcohol use: Yes    Comment: 5 times per week   . Drug use: No  . Sexual activity: Not on file

## 2020-04-19 IMAGING — MR MRI OF THE RIGHT KNEE WITHOUT CONTRAST
6 series · 40 of 40 positions shown · non-contrast
Comparison: None.

CLINICAL DATA: Right knee stiffness and swelling

EXAM:
MRI OF THE RIGHT KNEE WITHOUT CONTRAST
TECHNIQUE: Multiplanar, multisequence MR imaging of the knee was performed. No
intravenous contrast was administered.

[Series 8: T2 fat-sat · axial · right · 4.0mm · 0.56mm/px · z∈[-66,+57]mm · 5 of 26 slices shown (1 of 3)]
[im 1/26]
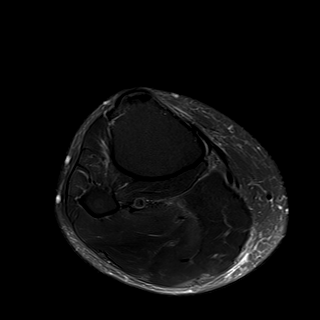
[im 7/26]
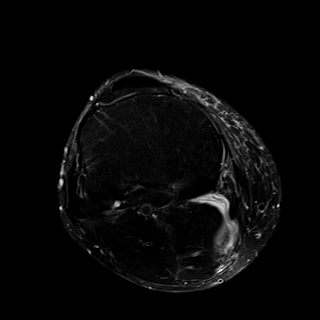
[im 13/26]
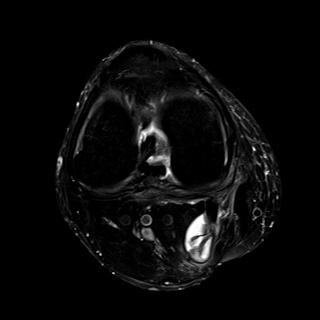
[im 19/26]
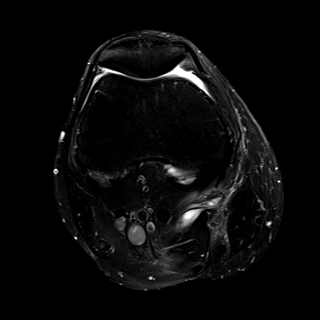
[im 26/26]
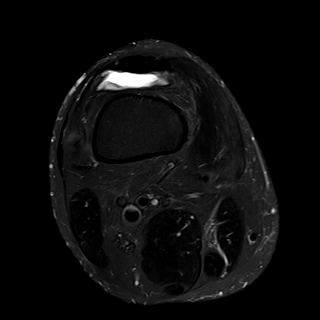

[Series 9: T1 · coronal · right · 4.0mm · 0.59mm/px · 7 of 30 slices shown]
[im 1/30]
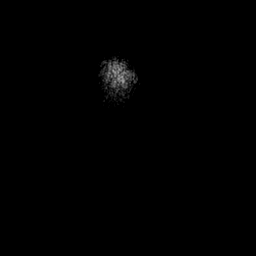
[im 5/30]
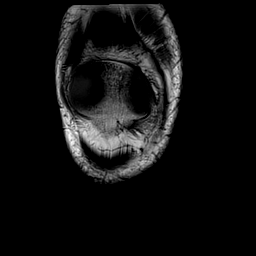
[im 10/30]
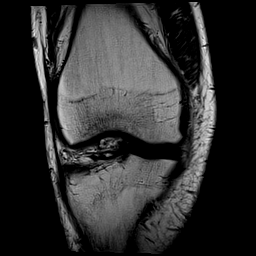
[im 15/30]
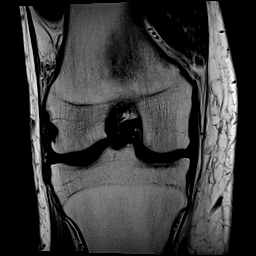
[im 20/30]
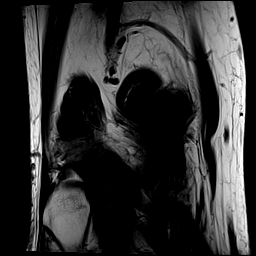
[im 25/30]
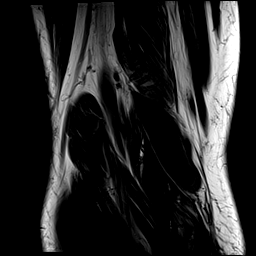
[im 30/30]
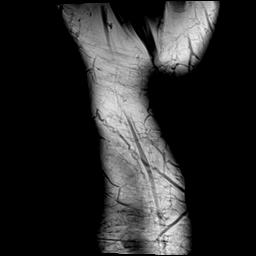

[Series 10: T2 fat-sat · coronal · right · 4.0mm · 0.59mm/px · 7 of 30 slices shown (2 of 3)]
[im 1/30]
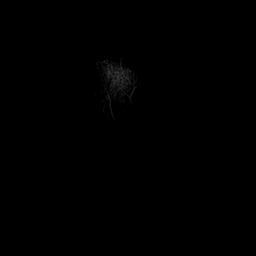
[im 5/30]
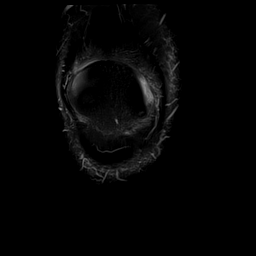
[im 10/30]
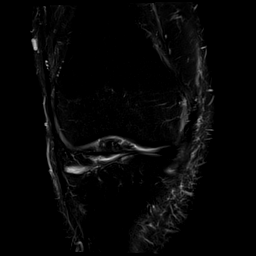
[im 15/30]
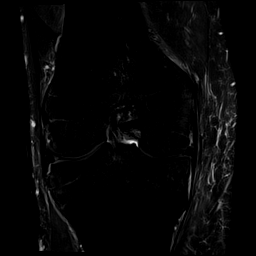
[im 20/30]
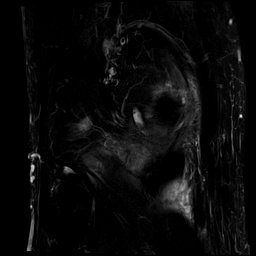
[im 25/30]
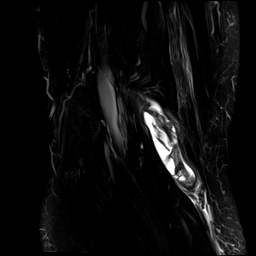
[im 30/30]
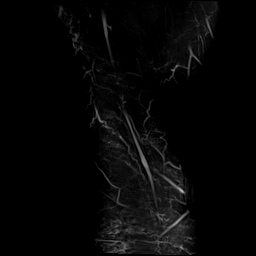

[Series 11: PD fat-sat · coronal · right · 4.0mm · 0.59mm/px · 7 of 30 slices shown (1 of 2)]
[im 1/30]
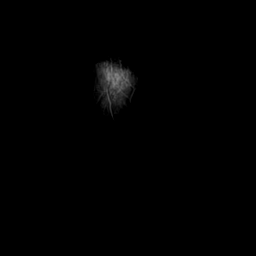
[im 5/30]
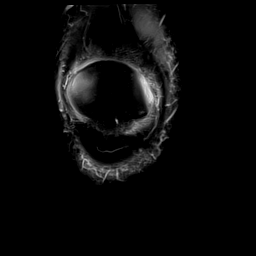
[im 10/30]
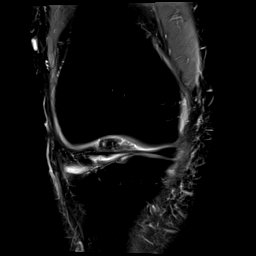
[im 15/30]
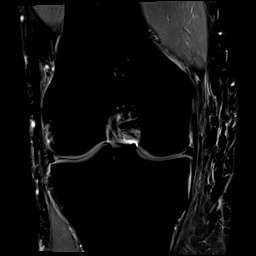
[im 20/30]
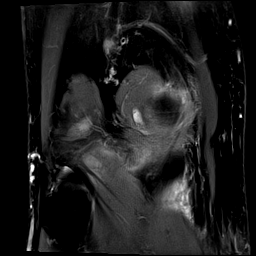
[im 25/30]
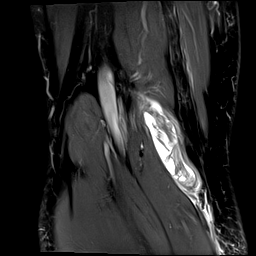
[im 30/30]
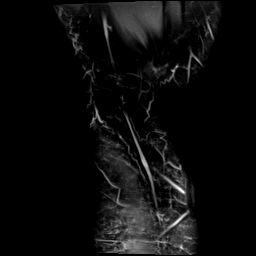

[Series 12: PD fat-sat · sagittal · right · 3.0mm · 0.66mm/px · 7 of 31 slices shown (2 of 2)]
[im 1/31]
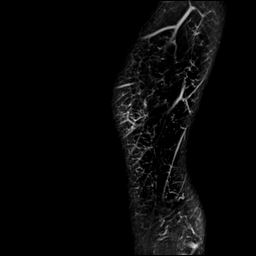
[im 6/31]
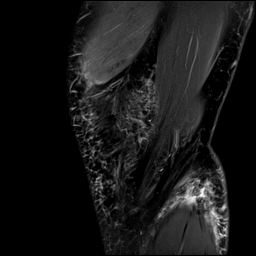
[im 11/31]
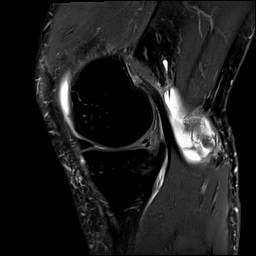
[im 16/31]
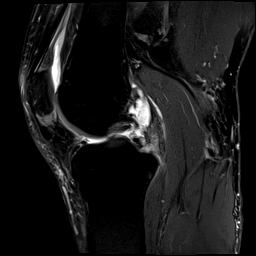
[im 21/31]
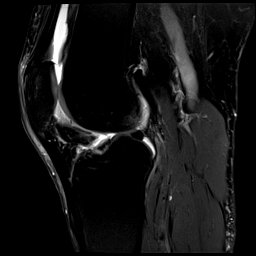
[im 26/31]
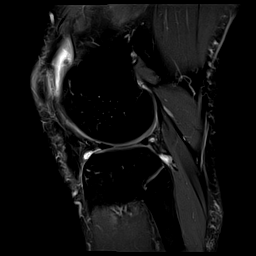
[im 31/31]
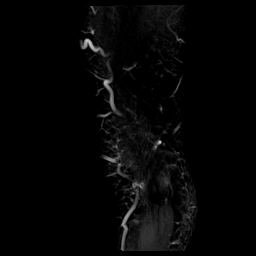

[Series 13: T2 fat-sat · sagittal · right · 3.0mm · 0.59mm/px · 7 of 31 slices shown (3 of 3)]
[im 1/31]
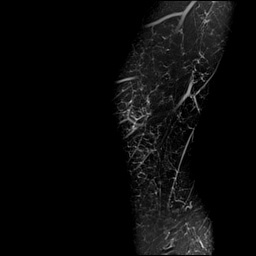
[im 6/31]
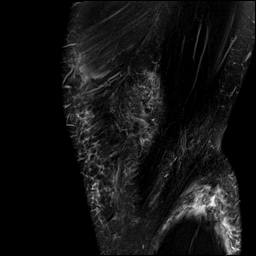
[im 11/31]
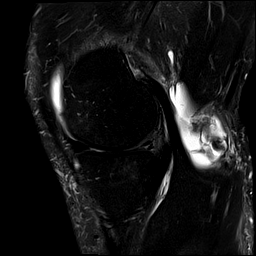
[im 16/31]
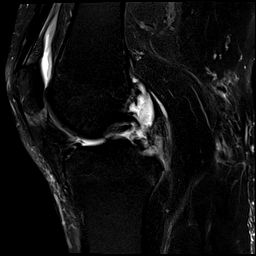
[im 21/31]
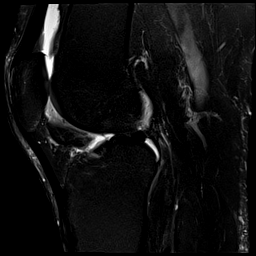
[im 26/31]
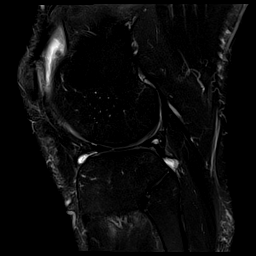
[im 31/31]
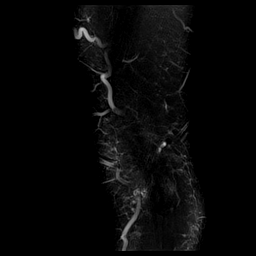

[40 of 40 positions shown; findings below may reference images not displayed]

FINDINGS: MENISCI

Medial meniscus: Complex tear of the body and posterior horn of the
medial meniscus with a radial component.

Lateral meniscus: Small radial tear at the free edge of the
posterior horn of the lateral meniscus. Radial tear of the free edge
of the body of the lateral meniscus.

LIGAMENTS

Cruciates:  Intact ACL and PCL.

Collaterals: Medial collateral ligament is intact. Lateral
collateral ligament complex is intact.

CARTILAGE

Patellofemoral:  No chondral defect.

Medial:  No chondral defect.

Lateral:  No chondral defect.

Joint: Moderate joint effusion. Minimal edema in Hoffa's fat. No
plical thickening.

Popliteal Fossa:  Small Baker's cyst with mild synovitis.

Extensor Mechanism: Intact quadriceps tendon. Intact patellar
tendon. Intact medial patellar retinaculum. Intact lateral patellar
retinaculum. Intact MPFL.

Bones:  No acute osseous abnormality.  No aggressive osseous lesion.

Other: No fluid collection or hematoma. Muscles are normal. No soft
tissue mass.
IMPRESSION: 1. Complex tear of the body and posterior horn of the medial
meniscus with a radial component.
2. Small radial tear at the free edge of the posterior horn of the
lateral meniscus. Radial tear of the free edge of the body of the
lateral meniscus.

## 2020-07-09 LAB — COLOGUARD: COLOGUARD: NEGATIVE

## 2020-07-09 LAB — EXTERNAL GENERIC LAB PROCEDURE: COLOGUARD: NEGATIVE

## 2022-01-04 DIAGNOSIS — Z7960 Long term (current) use of unspecified immunomodulators and immunosuppressants: Secondary | ICD-10-CM | POA: Diagnosis not present

## 2022-01-04 DIAGNOSIS — M1A09X1 Idiopathic chronic gout, multiple sites, with tophus (tophi): Secondary | ICD-10-CM | POA: Diagnosis not present

## 2022-01-04 DIAGNOSIS — L409 Psoriasis, unspecified: Secondary | ICD-10-CM | POA: Diagnosis not present

## 2022-01-04 DIAGNOSIS — L4050 Arthropathic psoriasis, unspecified: Secondary | ICD-10-CM | POA: Diagnosis not present

## 2022-03-15 DIAGNOSIS — Z09 Encounter for follow-up examination after completed treatment for conditions other than malignant neoplasm: Secondary | ICD-10-CM | POA: Diagnosis not present

## 2022-03-15 DIAGNOSIS — D2271 Melanocytic nevi of right lower limb, including hip: Secondary | ICD-10-CM | POA: Diagnosis not present

## 2022-03-15 DIAGNOSIS — L708 Other acne: Secondary | ICD-10-CM | POA: Diagnosis not present

## 2022-03-15 DIAGNOSIS — D2261 Melanocytic nevi of right upper limb, including shoulder: Secondary | ICD-10-CM | POA: Diagnosis not present

## 2022-03-15 DIAGNOSIS — Z872 Personal history of diseases of the skin and subcutaneous tissue: Secondary | ICD-10-CM | POA: Diagnosis not present

## 2022-03-15 DIAGNOSIS — D2262 Melanocytic nevi of left upper limb, including shoulder: Secondary | ICD-10-CM | POA: Diagnosis not present

## 2022-03-15 DIAGNOSIS — L400 Psoriasis vulgaris: Secondary | ICD-10-CM | POA: Diagnosis not present

## 2022-04-24 ENCOUNTER — Ambulatory Visit
Admission: RE | Admit: 2022-04-24 | Discharge: 2022-04-24 | Disposition: A | Discharge status: Home / Self Care | Payer: 59 | Source: Ambulatory Visit

## 2022-04-24 ENCOUNTER — Other Ambulatory Visit: Payer: Self-pay

## 2022-04-24 VITALS — BP 148/86 | HR 64 | Temp 98.3°F | Resp 16

## 2022-04-24 DIAGNOSIS — H6592 Unspecified nonsuppurative otitis media, left ear: Secondary | ICD-10-CM | POA: Diagnosis not present

## 2022-04-24 DIAGNOSIS — J0110 Acute frontal sinusitis, unspecified: Secondary | ICD-10-CM

## 2022-04-24 DIAGNOSIS — R0981 Nasal congestion: Secondary | ICD-10-CM | POA: Diagnosis not present

## 2022-04-24 DIAGNOSIS — J3489 Other specified disorders of nose and nasal sinuses: Secondary | ICD-10-CM | POA: Diagnosis not present

## 2022-04-24 MED ORDER — AMOXICILLIN-POT CLAVULANATE 875-125 MG PO TABS: 1.0000 | ORAL_TABLET | Freq: Two times a day (BID) | ORAL | 0 refills | Status: AC

## 2022-04-24 MED ORDER — PREDNISONE 50 MG PO TABS: 50.0000 mg | ORAL_TABLET | Freq: Every day | ORAL | 0 refills | Status: AC

## 2022-04-24 MED ORDER — FLUTICASONE PROPIONATE 50 MCG/ACT NA SUSP: 1.0000 | Freq: Two times a day (BID) | NASAL | 0 refills | Status: AC

## 2022-04-24 NOTE — ED Provider Notes (Signed)
Renaldo Fiddler    CSN: [id number] Arrival date & time: 04/24/22  1025      History   Chief Complaint Chief Complaint  Patient presents with   Cough   APPT 1030   Facial Pain    HPI Jackson White is a 51 y.o. male.   Pleasant 51yo male who presents today primarily due to concern of nasal congestion, nasal/sinus pain, dry cough and body aches. He also is having left ear pain with decreased hearing. States that sister in law was sick with similar symptoms last week. Pt is on Humira since January, but denies recurrent infections since starting. Denies fever, sore throat, HA, nuchal rigidity or anosmia. Denies known covid exposure, did not take home test. States he feels like he has the flu. Sx started initially Tuesday night and have gotten worse, nasal pain and left ear pain are the newest sx. Has taken OTC meds with no relief. Denies additional complaints today.    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    Past Surgical History:  Procedure Laterality Date   [place] hip resurface     right hip 2008   CYST EXCISION     KNEE ARTHROSCOPY WITH MEDIAL MENISECTOMY Right 08/14/2019   Procedure: RIGHT KNEE ARTHROSCOPY WITH PARTIAL MEDIAL MENISECTOMY;  Surgeon: Kathryne Hitch, MD;  Location: [facility];  Service: Orthopedics;  Laterality: Right;   skin cancer removed      right shoulder area 3 years ago    TOTAL HIP ARTHROPLASTY Left 04/02/2016   Procedure: LEFT TOTAL HIP ARTHROPLASTY ANTERIOR APPROACH;  Surgeon: Kathryne Hitch, MD;  Location: [facility];  Service: Orthopedics;  Laterality: Left;        Home Medications    Prior to Admission medications   Medication Sig Start Date End Date Taking? Authorizing Provider  allopurinol (ZYLOPRIM) 300 MG tablet Take 450 mg by mouth daily. 04/02/22  Yes [provider]  amoxicillin-clavulanate (AUGMENTIN) 875-125 MG tablet Take 1 tablet by mouth 2 (two) times daily with a meal for 10 days. 04/24/22 05/04/22 Yes Aleysha Meckler L, PA  fluticasone (FLONASE) 50 MCG/ACT nasal spray Place 1 spray into both nostrils in the morning and at bedtime. 04/24/22  Yes Brynnley Dayrit L, PA  HUMIRA PEN 40 MG/0.4ML PNKT Inject into the skin. "every 14 days" 04/20/22  Yes [provider]  predniSONE (DELTASONE) 50 MG tablet Take 1 tablet (50 mg total) by mouth daily with breakfast for 5 days. 04/24/22 04/29/22 Yes Chandy Tarman L, PA  acetaminophen (TYLENOL) 650 MG CR tablet Take 650-1,300 mg by mouth 2 (two) times daily as needed for pain.    [provider]  ASHWAGANDHA PO Take by mouth.    Alver Fisher, RN  ibuprofen (ADVIL,MOTRIN) 200 MG tablet Take 400 mg by mouth every 8 (eight) hours as needed for headache or moderate pain.     [provider]  Multiple Vitamin (MULTIVITAMIN) capsule Take 1 capsule by mouth daily.    [provider]    Family History History reviewed. No pertinent family  history.  Social History Social History   Tobacco Use   Smoking status: Former    Packs/day: 0.25    Years: 12.00    Total pack years: 3.00    Types: Cigarettes    Quit date: 09/20/2012    Years since quitting: 9.6   Smokeless tobacco: Never  Substance Use Topics   Alcohol use: Yes    Comment: 5 times per week    Drug use: No     Allergies   Hydrocodone-acetaminophen, Meloxicam, and Meperidine   Review of Systems Review of Systems  Respiratory:  Positive for cough.   As per HPI   Physical Exam Triage Vital Signs ED Triage Vitals  Enc Vitals Group     BP 04/24/22 1032 (!) 148/86     Pulse Rate 04/24/22  1032 64     Resp 04/24/22 1032 16     Temp 04/24/22 1032 98.3 F (36.8 C)     Temp Source 04/24/22 1032 Oral     SpO2 04/24/22 1032 96 %     Weight --      Height --      Head Circumference --      Peak Flow --      Pain Score 04/24/22 1037 0     Pain Loc --      Pain Edu? --      Excl. in GC? --    No data found.  Updated Vital Signs BP (!) 148/86 (BP Location: Left Arm)   Pulse 64   Temp 98.3 F (36.8 C) (Oral)   Resp 16   SpO2 96%   Visual Acuity Right Eye Distance:   Left Eye Distance:   Bilateral Distance:    Right Eye Near:   Left Eye Near:    Bilateral Near:     Physical Exam Vitals and nursing note reviewed.  Constitutional:      General: He is not in acute distress.    Appearance: Normal appearance. He is well-developed and normal weight. He is ill-appearing. He is not toxic-appearing or diaphoretic.  HENT:     Head: Normocephalic and atraumatic.     Right Ear: Tympanic membrane, ear canal and external ear normal. There is no impacted cerumen.     Left Ear: Ear canal and external ear normal. A middle ear effusion (thick amber purulent) is present. There is no impacted cerumen. Tympanic membrane is retracted.     Nose: Congestion and rhinorrhea present. Rhinorrhea is purulent.     Right Sinus: Frontal sinus tenderness present. No maxillary sinus tenderness.     Left Sinus: Frontal sinus tenderness present. No maxillary sinus tenderness.     Mouth/Throat:     Mouth: Mucous membranes are moist.     Pharynx: Oropharynx is clear. No oropharyngeal exudate or posterior oropharyngeal erythema.  Eyes:     General:        Right eye: No discharge.        Left eye: No discharge.     Extraocular Movements: Extraocular movements intact.     Conjunctiva/sclera: Conjunctivae normal.     Pupils: Pupils are equal, round, and reactive to light.  Cardiovascular:     Rate and Rhythm: Normal rate and regular rhythm.     Heart sounds: No murmur heard. Pulmonary:      Effort: Pulmonary effort is normal. No respiratory distress.     Breath sounds: Normal breath sounds.  Abdominal:     Palpations: Abdomen is soft.  Tenderness: There is no abdominal tenderness.  Musculoskeletal:        General: No swelling.     Cervical back: Normal range of motion and neck supple. No rigidity or tenderness.  Lymphadenopathy:     Cervical: No cervical adenopathy.  Skin:    General: Skin is warm and dry.     Capillary Refill: Capillary refill takes less than 2 seconds.  Neurological:     Mental Status: He is alert.  Psychiatric:        Mood and Affect: Mood normal.      UC Treatments / Results  Labs (all labs ordered are listed, but only abnormal results are displayed) Labs Reviewed - No data to display  EKG   Radiology No results found.  Procedures Procedures (including critical care time)  Medications Ordered in UC Medications - No data to display  Initial Impression / Assessment and Plan / UC Course  I have reviewed the triage vital signs and the nursing notes.  Pertinent labs & imaging results that were available during my care of the patient were reviewed by me and considered in my medical decision making (see chart for details).     Frontal sinusitis - discussed with pt that covid is a possible cause of his symptoms, however pt deferring testing as today is day 5 and thus tx would not be initiated once test results were obtained.  Mucoid OM L ear - likely secondary to #1. Start augmentin, flonase and pred to open ET and clear mucous. Nasal congestion - flonase. Consider OTC antihistamine, saline spray. Stay hydrated and rest   Final Clinical Impressions(s) / UC Diagnoses   Final diagnoses:  Acute non-recurrent frontal sinusitis  Mucoid otitis media of left ear with effusion  Nasal congestion with rhinorrhea     Discharge Instructions      You have a sinus/ upper respiratory tract infection, along with mucoid otitis media on the  left ear. Please start taking the antibiotic, Augmentin, twice daily with food. Take it for all 10 days, do not stop early just because you feel better. Take an over the counter probiotic or yogurt daily to help prevent diarrhea/ yeast infection. Continue taking allegra and mucinex once daily to help clear up the mucous. Use Flonase daily to help with inflammation of the nasal passage. Oral prednisone once daily in the morning will also help. It is also recommended that you use nasal saline/ sinus washes to cleans the sinus passages. Hot steam from a shower or vaporizer may also be beneficial to help open up the upper airway. Eucalyptus can be helpful. If any worsening symptoms such as headache, fever, or shortness of breath, please go to an in person evaluation/ urgent care.      ED Prescriptions     Medication Sig Dispense Auth. Provider   predniSONE (DELTASONE) 50 MG tablet Take 1 tablet (50 mg total) by mouth daily with breakfast for 5 days. 5 tablet Elfriede Bonini L, PA   amoxicillin-clavulanate (AUGMENTIN) 875-125 MG tablet Take 1 tablet by mouth 2 (two) times daily with a meal for 10 days. 20 tablet Jermany Sundell L, PA   fluticasone (FLONASE) 50 MCG/ACT nasal spray Place 1 spray into both nostrils in the morning and at bedtime. 16 mL Zakyia Gagan L, PA      PDMP not reviewed this encounter.   Maretta Bees, [place] 04/25/22 [phone]

## 2022-04-24 NOTE — ED Triage Notes (Signed)
Patient c/o productive cough w/ " dark green" sputum and facial pressure x 3 days.   Patient denies fever, generalized body aches, or fatigue.   Patient endorses headache upon onset of symptoms.   Patient endorses throat scratchiness at times.   Patient has taken Allegra and Mucinex with no relief of symptoms.

## 2022-04-24 NOTE — Discharge Instructions (Addendum)
You have a sinus/ upper respiratory tract infection, along with mucoid otitis media on the left ear. Please start taking the antibiotic, Augmentin, twice daily with food. Take it for all 10 days, do not stop early just because you feel better. Take an over the counter probiotic or yogurt daily to help prevent diarrhea/ yeast infection. Continue taking allegra and mucinex once daily to help clear up the mucous. Use Flonase daily to help with inflammation of the nasal passage. Oral prednisone once daily in the morning will also help. It is also recommended that you use nasal saline/ sinus washes to cleans the sinus passages. Hot steam from a shower or vaporizer may also be beneficial to help open up the upper airway. Eucalyptus can be helpful. If any worsening symptoms such as headache, fever, or shortness of breath, please go to an in person evaluation/ urgent care.

## 2022-05-17 DIAGNOSIS — M1A09X0 Idiopathic chronic gout, multiple sites, without tophus (tophi): Secondary | ICD-10-CM | POA: Diagnosis not present

## 2022-05-17 DIAGNOSIS — L4050 Arthropathic psoriasis, unspecified: Secondary | ICD-10-CM | POA: Diagnosis not present

## 2022-05-17 DIAGNOSIS — L409 Psoriasis, unspecified: Secondary | ICD-10-CM | POA: Diagnosis not present

## 2022-05-17 DIAGNOSIS — Z7960 Long term (current) use of unspecified immunomodulators and immunosuppressants: Secondary | ICD-10-CM | POA: Diagnosis not present

## 2022-09-06 DIAGNOSIS — L4050 Arthropathic psoriasis, unspecified: Secondary | ICD-10-CM | POA: Diagnosis not present

## 2022-09-06 DIAGNOSIS — M1A09X0 Idiopathic chronic gout, multiple sites, without tophus (tophi): Secondary | ICD-10-CM | POA: Diagnosis not present

## 2022-09-06 DIAGNOSIS — Z7960 Long term (current) use of unspecified immunomodulators and immunosuppressants: Secondary | ICD-10-CM | POA: Diagnosis not present

## 2022-09-06 DIAGNOSIS — M1A00X0 Idiopathic chronic gout, unspecified site, without tophus (tophi): Secondary | ICD-10-CM | POA: Diagnosis not present

## 2022-09-06 DIAGNOSIS — L409 Psoriasis, unspecified: Secondary | ICD-10-CM | POA: Diagnosis not present

## 2023-08-01 LAB — COLOGUARD: COLOGUARD: NEGATIVE

## 2024-01-16 NOTE — Progress Notes (Signed)
 Jackson White is a 53 y.o. male here for follow up of their medical problems  CHIEF COMPLAINT:  Follow up medical problems in the problem list and as discussed in the history and assessment areas as well as new complaints as listed.   Patient Active Problem List  Diagnosis  . HTN (hypertension), benign  . Psoriasis  . Stress headaches  . OSA (obstructive sleep apnea)  . Stem cell donor  . Healthcare maintenance  . Psoriatic arthritis (CMS/HHS-HCC)  . Chronic gouty arthritis     HISTORY OF PRESENT ILLNESS:  Psoriatic arthritis (CMS-HCC) Rheumatology follows   OSA (obstructive sleep apnea) Has been intolerant to cpap   HTN (hypertension), benign Taking medications without noted side effects or dizziness.    Chronic gouty arthritis Has done well on allopurinol.   Past Medical History:  Diagnosis Date  . Gout   . Hypertension   . OSA (obstructive sleep apnea)   . Osteoarthritis   . Psoriasis   . Sleep disturbance    worse in supine position    Past Surgical History:  Procedure Laterality Date  . JOINT REPLACEMENT  08/08/2007   [place] [facility] ([facility])  . KNEE ARTHROSCOPY  08/14/2019  . L hip total replacement Left   . REPLACEMENT TOTAL KNEE Right   . Right hip hemereplacement    . Right pilonidal cyst removal     . total knee replacement Left      No fever chills or sweats   No nausea, vomiting or diarrhea  No chest pain, shortness of breath   Social History   Socioeconomic History  . Marital status: Single  Tobacco Use  . Smoking status: Former    Current packs/day: 0.00    Types: Cigarettes    Quit date: 07/17/2002    Years since quitting: 21.5    Passive exposure: Past  . Smokeless tobacco: Never  . Tobacco comments:    Social smoker  Vaping Use  . Vaping status: Never Used  Substance and Sexual Activity  . Alcohol use: Yes  . Drug use: No  . Sexual activity: Not Currently    Partners: Female    Birth control/protection: Condom    Social Drivers of Health   Financial Resource Strain: Low Risk  (07/18/2023)   Overall Financial Resource Strain (CARDIA)   . Difficulty of Paying Living Expenses: Not hard at all  Food Insecurity: No Food Insecurity (07/18/2023)   Hunger Vital Sign   . Worried About Programme Researcher, Broadcasting/film/video in the Last Year: Never true   . Ran Out of Food in the Last Year: Never true  Transportation Needs: No Transportation Needs (07/18/2023)   PRAPARE - Transportation   . Lack of Transportation (Medical): No   . Lack of Transportation (Non-Medical): No  Housing Stability: Unknown (12/21/2023)   Housing Stability Vital Sign   . Unable to Pay for Housing in the Last Year: No   . Homeless in the Last Year: No      Current Outpatient Medications:  .  acetaminophen  (TYLENOL ) 500 MG tablet, Take 500 mg by mouth every 6 (six) hours as needed for Pain, Disp: , Rfl:  .  allopurinoL (ZYLOPRIM) 300 MG tablet, Take 1.5 tablets (450 mg total) by mouth once daily, Disp: 135 tablet, Rfl: 1 .  COSENTYX PEN, 2 PENS, 150 mg/mL PnIj, Inject 300 mg subcutaneously every 28 (twenty-eight) days, Disp: 3 mL, Rfl: 1 .  ibuprofen (MOTRIN) 200 MG tablet, Take 200 mg by mouth every  6 (six) hours as needed, Disp: , Rfl:  .  losartan-hydroCHLOROthiazide (HYZAAR) 100-12.5 mg tablet, Take 1 tablet by mouth once daily, Disp: 90 tablet, Rfl: 3  Vitals:   01/16/24 0810  BP: (!) 144/90  Pulse: 67   Body mass index is 28.63 kg/m. No acute distress Lungs; clear to ascultation Heart; Regular rate and rhythm  Abdomen; Soft and flat, normal bowel sounds Extremities; No clubbing, cyanosis or edema  Office Visit on 12/26/2023  Component Date Value Ref Range Status  . AST  12/26/2023 19  8 - 39 U/L Final  . Albumin 12/26/2023 4.5  3.5 - 4.8 g/dL Final  . ALT  95/92/7974 17  6 - 57 U/L Final  . WBC (White Blood Cell Count) 12/26/2023 4.3  4.1 - 10.2 10^3/uL Final  . RBC (Red Blood Cell Count) 12/26/2023 4.48 (L)  4.69 - 6.13 10^6/uL  Final  . Hemoglobin 12/26/2023 14.6  14.1 - 18.1 gm/dL Final  . Hematocrit 95/92/7974 42.6  40.0 - 52.0 % Final  . MCV (Mean Corpuscular Volume) 12/26/2023 95.1  80.0 - 100.0 fl Final  . MCH (Mean Corpuscular Hemoglobin) 12/26/2023 32.6 (H)  27.0 - 31.2 pg Final  . MCHC (Mean Corpuscular Hemoglobin * 12/26/2023 34.3  32.0 - 36.0 gm/dL Final  . Platelet Count 12/26/2023 175  150 - 450 10^3/uL Final  . RDW-CV (Red Cell Distribution Widt* 12/26/2023 11.9  11.6 - 14.8 % Final  . MPV (Mean Platelet Volume) 12/26/2023 9.5  9.4 - 12.4 fl Final  . Neutrophils 12/26/2023 2.46  1.50 - 7.80 10^3/uL Final  . Lymphocytes 12/26/2023 1.29  1.00 - 3.60 10^3/uL Final  . Monocytes 12/26/2023 0.43  0.00 - 1.50 10^3/uL Final  . Eosinophils 12/26/2023 0.07  0.00 - 0.55 10^3/uL Final  . Basophils 12/26/2023 0.02  0.00 - 0.09 10^3/uL Final  . Neutrophil % 12/26/2023 57.2  32.0 - 70.0 % Final  . Lymphocyte % 12/26/2023 30.0  10.0 - 50.0 % Final  . Monocyte % 12/26/2023 10.0  4.0 - 13.0 % Final  . Eosinophil % 12/26/2023 1.6  1.0 - 5.0 % Final  . Basophil% 12/26/2023 0.5  0.0 - 2.0 % Final  . Immature Granulocyte % 12/26/2023 0.7  <=0.7 % Final  . Immature Granulocyte Count 12/26/2023 0.03  <=0.06 10^3/L Final  . Creatinine 12/26/2023 0.8  0.7 - 1.3 mg/dL Final  . Glomerular Filtration Rate (eGFR) 12/26/2023 107  >60 mL/min/1.73sq m Final  . Uric Acid 12/26/2023 3.3 (L)  4.4 - 7.6 mg/dL Final  . Sedimentation Rate-Automated 12/26/2023 4  0 - 20 mm/hr Final    ASSESSMENT  AND PLAN:  Diagnoses and all orders for this visit:  HTN (hypertension), benign Assessment & Plan: Taking medications without noted side effects or dizziness.    Orders: -     Comprehensive Metabolic Panel (CMP); Future -     Hemoglobin A1C; Future -     Lipid Panel w/calc LDL; Future -     PSA, Total (Screen); Future  Chronic gouty arthritis Assessment & Plan: Has done well on allopurinol.   Orders: -     Comprehensive  Metabolic Panel (CMP); Future -     Hemoglobin A1C; Future -     Lipid Panel w/calc LDL; Future -     PSA, Total (Screen); Future  Psoriatic arthritis (CMS/HHS-HCC) Assessment & Plan: Rheumatology follows    OSA (obstructive sleep apnea) Assessment & Plan: Has been intolerant to cpap    Other orders -  losartan-hydroCHLOROthiazide (HYZAAR) 100-12.5 mg tablet; Take 1 tablet by mouth once daily
